# Patient Record
Sex: Female | Born: 1970 | Race: White | Hispanic: No | Smoking: Current every day smoker
Health system: Southern US, Community
[De-identification: ages and names within clinical notes are randomized; demographics above are authoritative.]

## PROBLEM LIST (undated history)

## (undated) DIAGNOSIS — B1920 Unspecified viral hepatitis C without hepatic coma: Secondary | ICD-10-CM

## (undated) DIAGNOSIS — D649 Anemia, unspecified: Secondary | ICD-10-CM

## (undated) HISTORY — DX: Anemia, unspecified: D64.9

## (undated) HISTORY — PX: NOVASURE ABLATION: SHX5394

## (undated) HISTORY — PX: TUBAL LIGATION: SHX77

---

## 2004-09-09 ENCOUNTER — Emergency Department: Payer: Self-pay | Admitting: Emergency Medicine

## 2005-02-01 ENCOUNTER — Emergency Department: Payer: Self-pay | Admitting: General Practice

## 2005-06-25 ENCOUNTER — Emergency Department: Payer: Self-pay | Admitting: Emergency Medicine

## 2005-08-13 ENCOUNTER — Emergency Department (HOSPITAL_COMMUNITY): Admission: EM | Admit: 2005-08-13 | Discharge: 2005-08-13 | Payer: Self-pay | Admitting: Emergency Medicine

## 2007-09-22 ENCOUNTER — Ambulatory Visit: Payer: Self-pay | Admitting: Obstetrics and Gynecology

## 2007-10-08 ENCOUNTER — Ambulatory Visit: Payer: Self-pay | Admitting: Obstetrics and Gynecology

## 2008-01-10 ENCOUNTER — Emergency Department: Payer: Self-pay | Admitting: Emergency Medicine

## 2008-03-25 ENCOUNTER — Emergency Department: Payer: Self-pay | Admitting: Emergency Medicine

## 2009-09-21 ENCOUNTER — Emergency Department: Payer: Self-pay | Admitting: Emergency Medicine

## 2009-09-22 ENCOUNTER — Ambulatory Visit (HOSPITAL_COMMUNITY): Admission: EM | Admit: 2009-09-22 | Discharge: 2009-09-22 | Payer: Self-pay | Admitting: Emergency Medicine

## 2010-04-14 ENCOUNTER — Emergency Department: Payer: Self-pay | Admitting: Emergency Medicine

## 2010-10-17 LAB — POCT I-STAT, CHEM 8
Calcium, Ion: 1.08 mmol/L — ABNORMAL LOW (ref 1.12–1.32)
Chloride: 108 mEq/L (ref 96–112)
Creatinine, Ser: 0.7 mg/dL (ref 0.4–1.2)
Hemoglobin: 15.3 g/dL — ABNORMAL HIGH (ref 12.0–15.0)
Potassium: 3.9 mEq/L (ref 3.5–5.1)

## 2011-04-25 ENCOUNTER — Ambulatory Visit: Payer: Self-pay | Admitting: Family Medicine

## 2011-04-28 ENCOUNTER — Ambulatory Visit: Payer: Self-pay | Admitting: Family Medicine

## 2011-05-29 ENCOUNTER — Ambulatory Visit (INDEPENDENT_AMBULATORY_CARE_PROVIDER_SITE_OTHER): Payer: Self-pay | Admitting: Family Medicine

## 2011-05-29 ENCOUNTER — Encounter: Payer: Self-pay | Admitting: Family Medicine

## 2011-05-29 VITALS — BP 120/74 | HR 66 | Temp 97.7°F | Ht 61.0 in | Wt 136.0 lb

## 2011-05-29 DIAGNOSIS — Z862 Personal history of diseases of the blood and blood-forming organs and certain disorders involving the immune mechanism: Secondary | ICD-10-CM | POA: Insufficient documentation

## 2011-05-29 DIAGNOSIS — Z720 Tobacco use: Secondary | ICD-10-CM | POA: Insufficient documentation

## 2011-05-29 DIAGNOSIS — F17200 Nicotine dependence, unspecified, uncomplicated: Secondary | ICD-10-CM | POA: Insufficient documentation

## 2011-05-29 DIAGNOSIS — Z0000 Encounter for general adult medical examination without abnormal findings: Secondary | ICD-10-CM | POA: Insufficient documentation

## 2011-05-29 LAB — BASIC METABOLIC PANEL
BUN: 8 mg/dL (ref 6–23)
Chloride: 103 mEq/L (ref 96–112)
Creat: 0.68 mg/dL (ref 0.50–1.10)
Glucose, Bld: 82 mg/dL (ref 70–99)
Potassium: 3.9 mEq/L (ref 3.5–5.3)

## 2011-05-29 LAB — CBC
HCT: 44 % (ref 36.0–46.0)
Hemoglobin: 14.3 g/dL (ref 12.0–15.0)
MCHC: 32.5 g/dL (ref 30.0–36.0)
RBC: 4.65 MIL/uL (ref 3.87–5.11)
WBC: 10.6 10*3/uL — ABNORMAL HIGH (ref 4.0–10.5)

## 2011-05-29 NOTE — Patient Instructions (Signed)
Come back and see me after the [employer] in. We will do a Pap test at that time. We will check basic labs today. Have a good Thanksgiving!

## 2011-05-29 NOTE — Assessment & Plan Note (Signed)
Check basic metabolic panel and CBC today. We'll obtain Pap once patient has orange card. She is leaving here to talk to. Jaynee Eagles today.

## 2011-05-29 NOTE — Assessment & Plan Note (Signed)
Provided counseling to quit.  we'll revisit this at next visit

## 2011-05-29 NOTE — Progress Notes (Signed)
  Subjective:    Patient ID: Claudia Cantu, female    DOB: May 19, 1971, 40 y.o.   MRN: [id number]  HPI The patient here for complete physical exam. She has no real past medical history to speak of except for anemia which seems to be secondary to menorrhagia. Menorrhagia treated with NovaSure. Has not been tested for anemia since this was placed about 3 years ago. Last Pap 7 years.  Tobacco abuse: Patient is a current everyday smoker. She's cut down from 2 packs a day to one pack a day for the past several months. Would like to get through holidays and then is interested in cutting down on her smoking.   Review of Systems The patient denies fever, unusual weight change, decreased hearing, chest pain, palpitations, pre-syncopal or syncopal episodes, dyspnea on exertion, prolonged cough, hemoptysis, change in bowel habits, melena, hematochezia, severe indigestion/heartburn, nausea/vomiting/abdominal pain, genital sores, muscle weakness, difficulty walking, abnormal bleeding, or enlarged lymph nodes.       Objective:   Physical Exam  Gen:  Alert, cooperative patient who appears stated age in no acute distress.  Vital signs reviewed. HEENT:  [place]/AT.  EOMI, PERRL.  MMM, tonsils non-erythematous, non-edematous.  External ears WNL, Bilateral TM's normal without retraction, redness or bulging.  Neck: No masses or thyromegaly or limitation in range of motion.  No cervical lymphadenopathy. Pulm:  Clear to auscultation bilaterally with good air movement.  No wheezes or rales noted.   Cardiac: Grade II/VI SEM noted Right upper sternal border.  RRR Abd:  Soft/nondistended/nontender.  Good bowel sounds throughout all four quadrants.  No masses noted.  Ext:  No clubbing/cyanosis/erythema.  No edema noted bilateral lower extremities.         Assessment & Plan:

## 2011-05-29 NOTE — Assessment & Plan Note (Signed)
cbc

## 2011-05-30 ENCOUNTER — Encounter: Payer: Self-pay | Admitting: Family Medicine

## 2011-06-02 ENCOUNTER — Telehealth: Payer: Self-pay | Admitting: Family Medicine

## 2011-06-02 NOTE — Telephone Encounter (Signed)
Forwarded to pcp.Alsace Dowd Lynetta  

## 2011-06-02 NOTE — Telephone Encounter (Signed)
Please call pt asap with results of latest labs

## 2011-06-04 NOTE — Telephone Encounter (Signed)
Called patient, she had received results in mail.

## 2012-10-07 ENCOUNTER — Emergency Department: Payer: Self-pay | Admitting: Emergency Medicine

## 2012-10-07 LAB — COMPREHENSIVE METABOLIC PANEL
Alkaline Phosphatase: 76 U/L (ref 50–136)
Anion Gap: 2 — ABNORMAL LOW (ref 7–16)
BUN: 8 mg/dL (ref 7–18)
Calcium, Total: 8.6 mg/dL (ref 8.5–10.1)
Chloride: 108 mmol/L — ABNORMAL HIGH (ref 98–107)
Co2: 27 mmol/L (ref 21–32)
Creatinine: 0.85 mg/dL (ref 0.60–1.30)
EGFR (African American): >60
SGOT(AST): 35 U/L (ref 15–37)

## 2012-10-07 LAB — URINALYSIS, COMPLETE
Protein: NEGATIVE
WBC UR: <1 /HPF (ref 0–5)

## 2012-10-07 LAB — CBC
HCT: 42.8 % (ref 35.0–47.0)
Platelet: 167 10*3/uL (ref 150–440)

## 2012-10-26 ENCOUNTER — Ambulatory Visit: Payer: Self-pay | Admitting: Obstetrics and Gynecology

## 2012-10-26 LAB — BASIC METABOLIC PANEL: Potassium: 3.9 mmol/L (ref 3.5–5.1)

## 2012-11-01 ENCOUNTER — Ambulatory Visit: Payer: Self-pay | Admitting: Obstetrics and Gynecology

## 2014-02-17 ENCOUNTER — Inpatient Hospital Stay: Payer: Self-pay | Admitting: Specialist

## 2014-02-17 LAB — COMPREHENSIVE METABOLIC PANEL
ALBUMIN: 3.6 g/dL (ref 3.4–5.0)
ALT: 21 U/L
Alkaline Phosphatase: 70 U/L
Anion Gap: 10 (ref 7–16)
BUN: 19 mg/dL — AB (ref 7–18)
Bilirubin,Total: 0.3 mg/dL (ref 0.2–1.0)
CALCIUM: 8.5 mg/dL (ref 8.5–10.1)
CHLORIDE: 107 mmol/L (ref 98–107)
CREATININE: 0.85 mg/dL (ref 0.60–1.30)
Co2: 21 mmol/L (ref 21–32)
EGFR (Non-African Amer.): >60
Glucose: 109 mg/dL — ABNORMAL HIGH (ref 65–99)
OSMOLALITY: 279 (ref 275–301)
POTASSIUM: 3.6 mmol/L (ref 3.5–5.1)
SGOT(AST): 28 U/L (ref 15–37)
SODIUM: 138 mmol/L (ref 136–145)
TOTAL PROTEIN: 7.5 g/dL (ref 6.4–8.2)

## 2014-02-17 LAB — URINALYSIS, COMPLETE
BILIRUBIN, UR: NEGATIVE
Bacteria: NONE SEEN
Blood: NEGATIVE
GLUCOSE, UR: NEGATIVE mg/dL (ref 0–75)
LEUKOCYTE ESTERASE: NEGATIVE
Nitrite: NEGATIVE
PH: 8 (ref 4.5–8.0)
PROTEIN: NEGATIVE
RBC,UR: NONE SEEN /HPF (ref 0–5)
SPECIFIC GRAVITY: 1.016 (ref 1.003–1.030)
Squamous Epithelial: 15
WBC UR: NONE SEEN /HPF (ref 0–5)

## 2014-02-17 LAB — CBC
HCT: 30.7 % — AB (ref 35.0–47.0)
HGB: 9.8 g/dL — AB (ref 12.0–16.0)
MCH: 30.7 pg (ref 26.0–34.0)
MCHC: 31.9 g/dL — AB (ref 32.0–36.0)
MCV: 97 fL (ref 80–100)
Platelet: 423 10*3/uL (ref 150–440)
RBC: 3.19 10*6/uL — AB (ref 3.80–5.20)
RDW: 12.9 % (ref 11.5–14.5)
WBC: 12.6 10*3/uL — AB (ref 3.6–11.0)

## 2014-02-17 LAB — HEMOGLOBIN: HGB: 7.4 g/dL — AB (ref 12.0–16.0)

## 2014-02-17 LAB — LIPASE, BLOOD: LIPASE: 109 U/L (ref 73–393)

## 2014-02-17 LAB — PROTIME-INR
INR: 1.1
PROTHROMBIN TIME: 13.8 s (ref 11.5–14.7)

## 2014-02-17 LAB — APTT: Activated PTT: 28 secs (ref 23.6–35.9)

## 2014-02-18 LAB — BASIC METABOLIC PANEL
ANION GAP: 10 (ref 7–16)
BUN: 11 mg/dL (ref 7–18)
CALCIUM: 8 mg/dL — AB (ref 8.5–10.1)
CHLORIDE: 108 mmol/L — AB (ref 98–107)
CREATININE: 0.68 mg/dL (ref 0.60–1.30)
Co2: 21 mmol/L (ref 21–32)
EGFR (African American): >60
Glucose: 79 mg/dL (ref 65–99)
Osmolality: 276 (ref 275–301)
Potassium: 3.5 mmol/L (ref 3.5–5.1)
SODIUM: 139 mmol/L (ref 136–145)

## 2014-02-18 LAB — MAGNESIUM: Magnesium: 1.7 mg/dL — ABNORMAL LOW

## 2014-02-18 LAB — CBC WITH DIFFERENTIAL/PLATELET
BASOS ABS: 0 10*3/uL (ref 0.0–0.1)
Basophil %: 0.6 %
EOS ABS: 0.1 10*3/uL (ref 0.0–0.7)
EOS PCT: 0.9 %
HCT: 34.1 % — ABNORMAL LOW (ref 35.0–47.0)
HGB: 11.4 g/dL — AB (ref 12.0–16.0)
LYMPHS ABS: 2.5 10*3/uL (ref 1.0–3.6)
Lymphocyte %: 31.8 %
MCH: 31 pg (ref 26.0–34.0)
MCHC: 33.6 g/dL (ref 32.0–36.0)
MCV: 92 fL (ref 80–100)
MONOS PCT: 6.1 %
Monocyte #: 0.5 x10 3/mm (ref 0.2–0.9)
Neutrophil #: 4.8 10*3/uL (ref 1.4–6.5)
Neutrophil %: 60.6 %
Platelet: 217 10*3/uL (ref 150–440)
RBC: 3.69 10*6/uL — ABNORMAL LOW (ref 3.80–5.20)
RDW: 14.2 % (ref 11.5–14.5)
WBC: 8 10*3/uL (ref 3.6–11.0)

## 2014-02-19 LAB — CBC WITH DIFFERENTIAL/PLATELET
Basophil #: 0 10*3/uL (ref 0.0–0.1)
Basophil %: 0.7 %
EOS PCT: 1.2 %
Eosinophil #: 0.1 10*3/uL (ref 0.0–0.7)
HCT: 32 % — ABNORMAL LOW (ref 35.0–47.0)
HGB: 11 g/dL — AB (ref 12.0–16.0)
LYMPHS ABS: 2.3 10*3/uL (ref 1.0–3.6)
Lymphocyte %: 33.2 %
MCH: 31.2 pg (ref 26.0–34.0)
MCHC: 34.2 g/dL (ref 32.0–36.0)
MCV: 91 fL (ref 80–100)
MONOS PCT: 6.7 %
Monocyte #: 0.5 x10 3/mm (ref 0.2–0.9)
NEUTROS PCT: 58.2 %
Neutrophil #: 4 10*3/uL (ref 1.4–6.5)
Platelet: 217 10*3/uL (ref 150–440)
RBC: 3.51 10*6/uL — AB (ref 3.80–5.20)
RDW: 14.2 % (ref 11.5–14.5)
WBC: 6.9 10*3/uL (ref 3.6–11.0)

## 2014-02-21 LAB — PATHOLOGY REPORT

## 2014-06-05 ENCOUNTER — Emergency Department: Payer: Self-pay | Admitting: Internal Medicine

## 2014-08-12 IMAGING — US US PELV - US TRANSVAGINAL
1 series · 13 of 25 positions shown · non-contrast
Comparison: none

REASON FOR EXAM: pain
COMMENTS:

[Series 1: us pelv - us transvaginal · 0.18mm/px · 106 acquisitions, 13 frames shown]
[im 1/106]
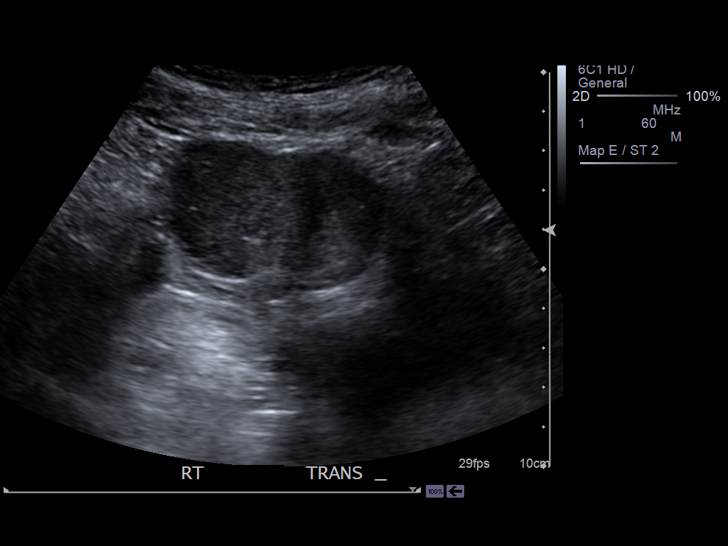
[im 9/106]
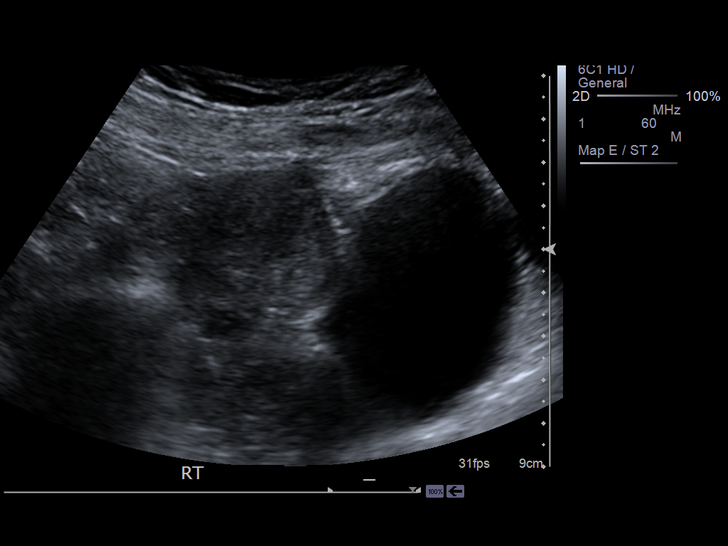
[im 18/106]
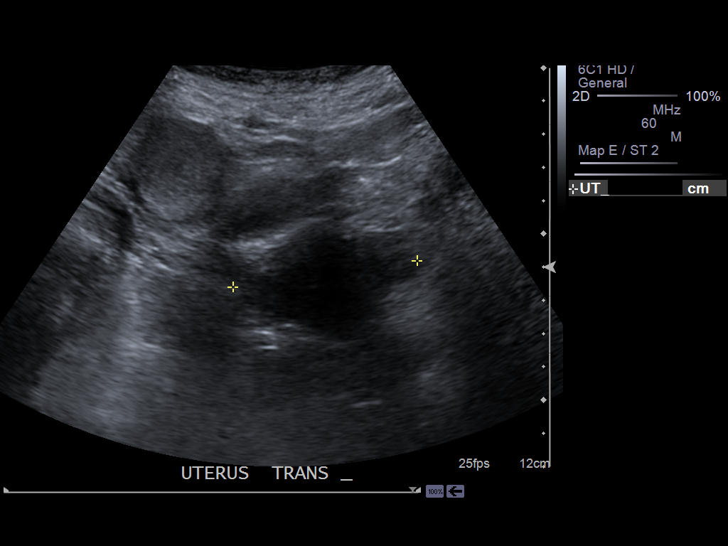
[im 27/106]
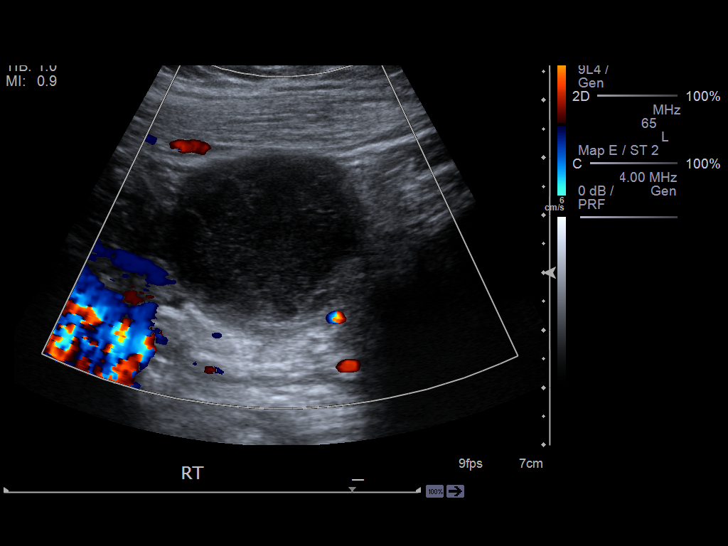
[im 36/106]
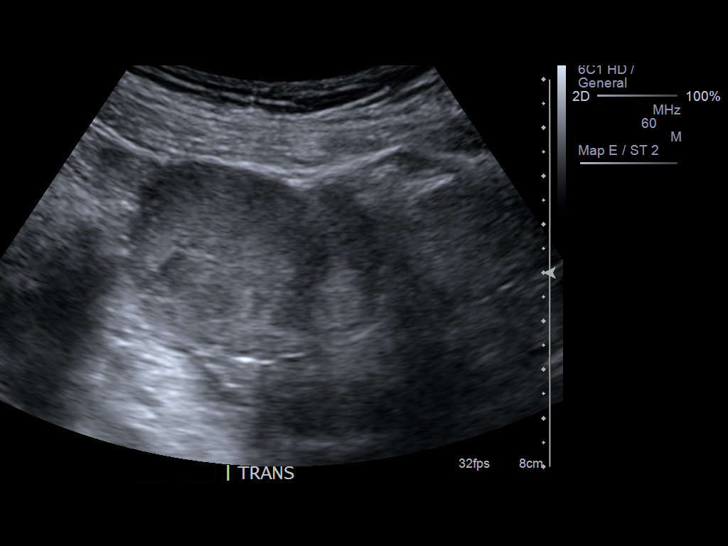
[im 44/106]
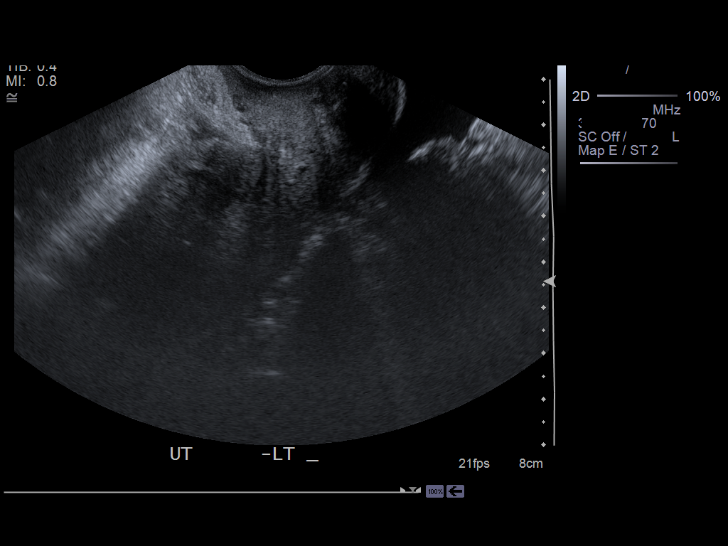
[im 53/106]
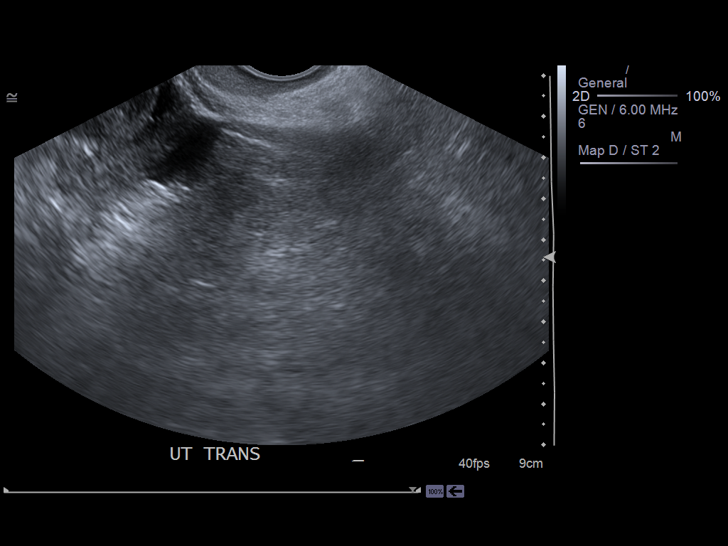
[im 62/106]
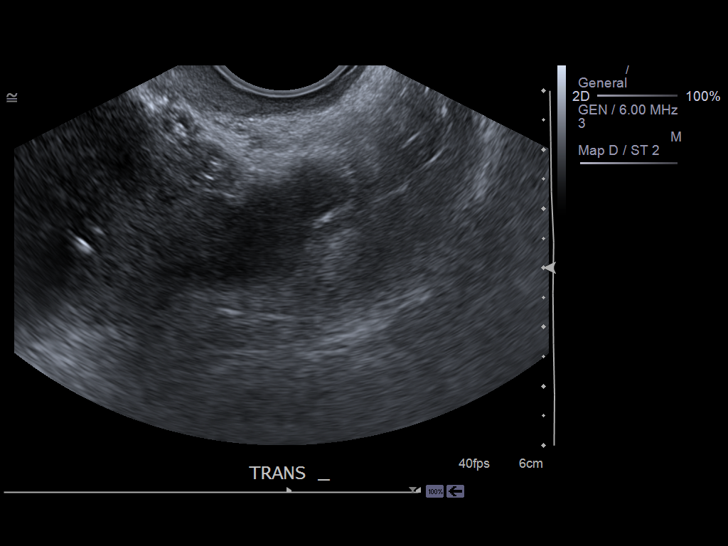
[im 71/106]
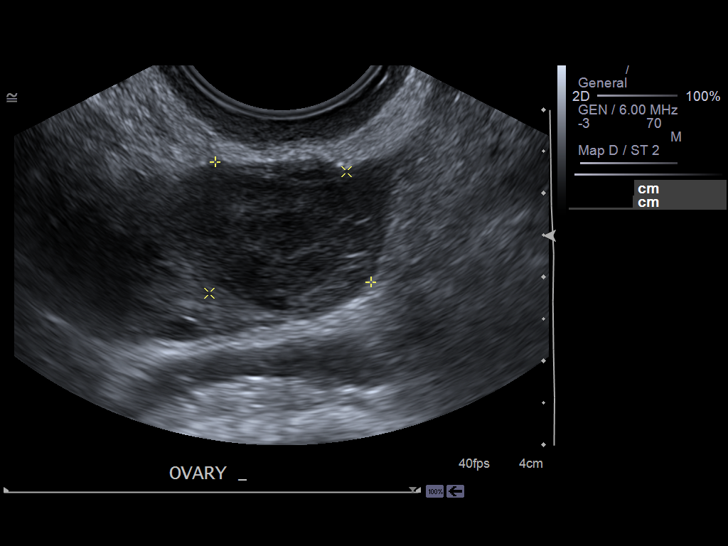
[im 79/106]
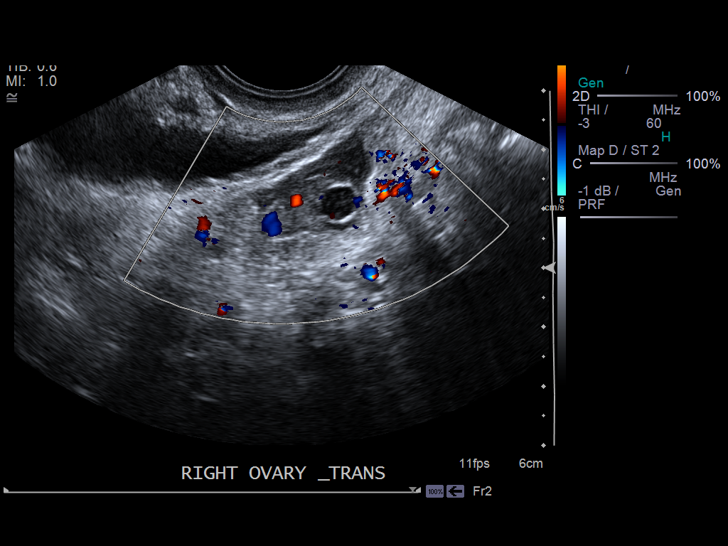
[im 88/106]
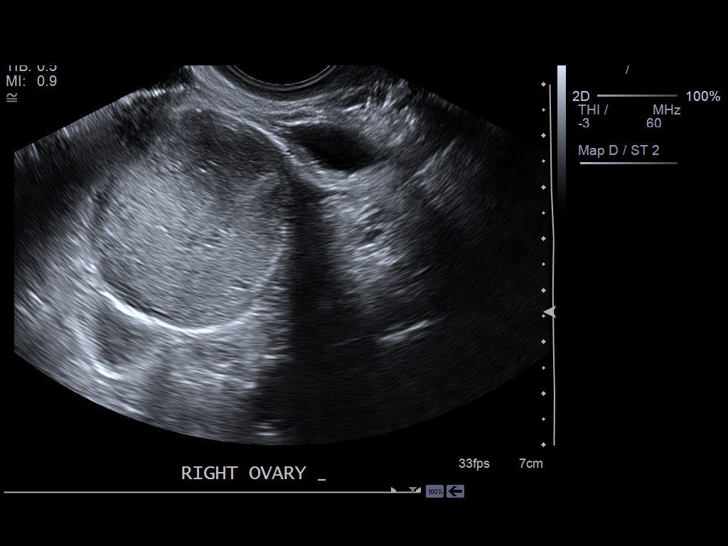
[im 97/106]
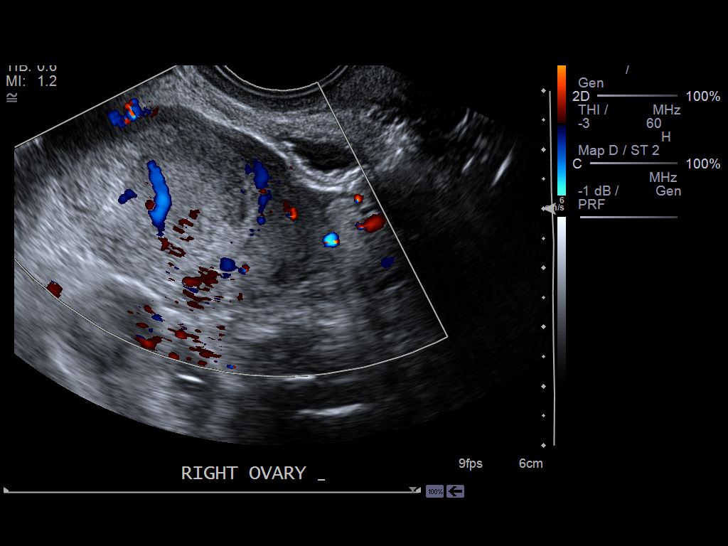
[im 106/106]
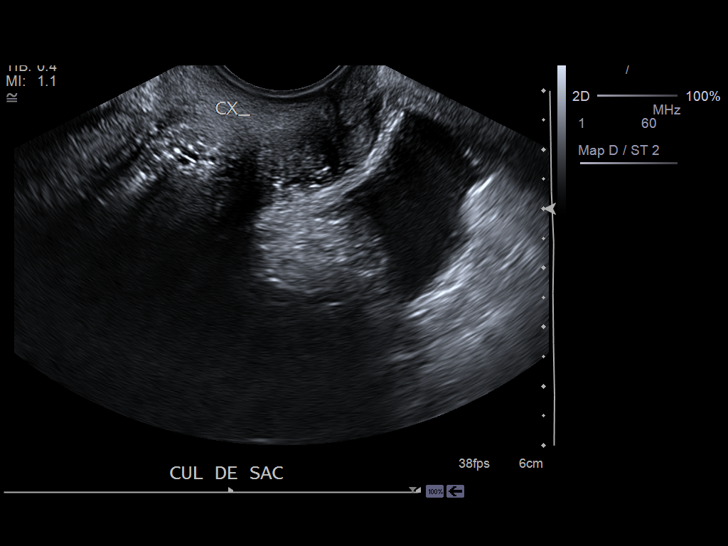

[13 of 25 positions shown; findings below may reference images not displayed]

PROCEDURE:     US  - US PELVIS EXAM W/TRANSVAGINAL  - October 07, 2012  [DATE]

RESULT:     Pelvic sonogram is performed with transabdominal and endovaginal
scanning. The patient has a current CT scan showing a pelvic mass in the
right adnexa. The uterus measures 7.94 x 3.44 x 5.55 cm. There is cul-de-sac
fluid present. The endometrial thickness is 6.2 mm. There is a an abnormal
appearance the cervix with possible punctate calcifications. There may be
some shadowing from these. There is a mixed echotexture predominantly solid
mass in the right adnexa with some adjacent normal-appearing ovarian tissue.
The entire complex measures approximately 5.58 x 3.69 x 4.37 cm. The left
ovary measures 3.18 x 2.00 x 3.38 cm and contains a hypoechoic structure
which could represent a proteinaceous or hemorrhagic cyst measuring 2.35 x
2.19 x 2.11 cm. The kidneys show no gross abnormality.
IMPRESSION: 1. Complex spurt only solid mass in the right ovary. Free fluid is seen in
the cul-de-sac. Some cervical calcification is present. There is the more
hypoechoic structure in the left ovary possibly hemorrhagic cyst or
proteinaceous cyst. Gynecologic followup is recommended.

[REDACTED]

## 2014-11-17 NOTE — Op Note (Signed)
PATIENT NAME:  Claudia Cantu, Claudia Cantu MR#:  [id number] DATE OF BIRTH:  04/09/1971  DATE OF PROCEDURE:  11/01/2012  PREOPERATIVE DIAGNOSES:  1.  Right pelvic pain.  2.  Complex right ovarian cyst.   POSTOPERATIVE DIAGNOSES: 1.  Right ovarian complex cyst.  2.  Pelvic pain.  3.  Simple left ovarian cyst.  PROCEDURES: 1.  Laparoscopic right salpingo-oophorectomy.  2.  Left ovarian cystotomy.  3.  Left salpingectomy.   INDICATIONS: A 44 year old gravida 4, para 3 patient with a 2-week history of right-sided pelvic pain requiring daily narcotics. Ultrasound and CT scan previously showed a complex right ovarian mass.   DESCRIPTION OF PROCEDURE: After adequate general endotracheal anesthesia, the patient was placed in the dorsal supine position. The patient's abdomen, perineum and vagina were prepped and draped in normal sterile fashion. The bladder was catheterized with a Foley catheter, yielding clear urine. A weighted speculum was placed in the vagina and the cervix was grasped with a single-toothed tenaculum and a Kahn cannula placed in the endocervical canal to be used for uterine manipulation during the procedure. Gloves were changed. A 10 mm infraumbilical incision was made, followed by placement of the laparoscope into the abdominal cavity under direct visualization. The patient's abdomen was then insufflated with carbon dioxide. A second port site, left lower quadrant, 3 cm medial to the anterior iliac spine under direct visualization. A  #10 mm port was then advanced into the abdominal cavity. A third port site was placed through the right lower quadrant again after injecting with 0.5% Marcaine, and a 5 mm port was advanced into the abdominal cavity under direct visualization. Attention was directed to the patient's right fallopian tube. Ovary seemed irregularly-shaped, approximately 3 x 2 cm. The fimbriated end of the fallopian tube was grasped and the infundibulopelvic ligament was cauterized with  the Kleppingers and the right tube and ovary were then sequentially clamped, cauterized and ultimately removed intact. Good hemostasis noted on the right side. This right tube and ovary was placed into an EndoCatch bag and this was removed from the left lower port site. Attention directed to the patient's left fallopian tube, which again was grasped at the fimbriated portion, and distal portion of fallopian tube was removed. The patient did have a previous tubal ligation. This was removed without difficulty. Of note, there was a 4 x 4 cm ovarian cyst. This was opened with the Harmonic scalpel and clear fluid resulted. The cystotomy edges were oozing, which ultimately was controlled with Bovie and Arista. The patient's abdomen was suctioned. The patient's appendix appeared normal. The upper abdomen appeared normal. No other significant scar tissue noted. Uterus appeared normal. The patient's abdomen was deflated, and the infraumbilical and left lower quadrant port sites were closed in a 2-layer fashion with the fascial layer closed with 2-0 Vicryl suture and skin was reapproximated with interrupted 4-0 Vicryl suture. The right lower quadrant site was closed with 4-0 Vicryl interrupted suture. All incisions were Steri-Stripped and Tegaderm placed on top. The single-toothed tenaculum was removed from the patient's vagina. There was no active bleeding. The procedure was terminated.   ESTIMATED BLOOD LOSS: 50 mL.   URINE OUTPUT: 100 mL.   URINE OUTFLOW: 700 mL.   The patient tolerated the procedure well and was taken to the recovery room in good condition.    ____________________________ Suzy Bouchardhomas J. Schermerhorn, MD tjs:dm D: 11/01/2012 10:42:00 ET T: 11/01/2012 11:07:45 ET JOB#: [id number]  cc: Suzy Bouchardhomas J. Schermerhorn, MD, <Dictator> Suzy BouchardHOMAS J SCHERMERHORN MD ELECTRONICALLY  SIGNED 11/03/2012 9:52

## 2014-11-18 NOTE — H&P (Signed)
PATIENT NAME:  Claudia Cantu, Claudia Cantu MR#:  [id number] DATE OF BIRTH:  03/10/1971  DATE OF ADMISSION:  02/17/2014  REASON FOR ADMISSION: GI bleeding and hematemesis.   PRIMARY CARE PHYSICIAN: Dr. Lacie Scotts.   REFERRING PHYSICIAN: Loleta Rose, MD.  HISTORY OF PRESENT ILLNESS: This is a very nice 44 year old female with history of previously diagnosed peptic ulcer disease, polyps, and chronic headaches. This patient came up today with a history of at 10:00 a.m. starting to have vomiting episodes since 10:00 a.m. until  2:30 p.m., black coffee-ground emesis with occasional dark blood on it. The patient states that last week she had a similar problem that lasted for 2 hours, vomited a couple of times with coffee ground and then went away. The patient denies any melena. She says that she hasn't had diarrhea. She is being slightly constipated. She has been having occasional abdominal cramps over the last 24-48 hours and, but overall, no significant abdominal pain. The patient states that she is being her normal self. No dizziness, lightheadedness, no difficulty ambulating. No shortness of breath. She came to the Emergency Department. In the Emergency Department, she has a positive guaiac and she has significant trouble for hemoglobin from previous labs down to 9.8, previously 14.5. The patient is admitted for evaluation and treatment of this condition.   REVIEW OF SYSTEMS:  CONSTITUTIONAL: No fever, fatigue or weakness.  EYES: No blurry vision, double vision.  EARS, NOSE, THROAT: No difficulty swallowing. No tinnitus.  RESPIRATORY: No shortness of breath, wheezing. The patient is a smoker. Smoking cessation counseling given for over 4 minutes. The patient states that she would like to try nicotine patches to see if she can at least slow down her smoking.  CARDIOVASCULAR: No chest pain, orthopnea, or edema.  GASTROINTESTINAL: Positive nausea, positive vomiting. Positive hematemesis. Denies melena. No abdominal  pain at this moment. GENITOURINARY:  No dysuria or hematuria.  GYNECOLOGIC: No obvious masses.  ENDOCRINE: No polyuria, polydipsia, polyphagia.  HEMATOLOGIC AND LYMPHATIC: No anemia, easy bruising or bleeding that she knew about.  SKIN: No rashes or petechiae.  MUSCULOSKELETAL: No significant neck pain.  NEUROLOGIC: Positive for back pain. She takes multiple BC powders, 5-6 a day.  PSYCHIATRIC: No insomnia or agitation.   PAST MEDICAL HISTORY:  1.  Chronic headaches. 2.  Chronic pain syndrome. 3.  Peptic ulcer disease. 4.  Colonic polyps.   ALLERGIES: PENICILLIN MAKES HER SICK ON THE STOMACH.  CODEINE MAKES HER SICK TO HER STOMACH AS WELL.   PAST SURGICAL HISTORY:  1.  EGD.  2.  Colonoscopy a year and a half ago.  3.  Cesarean section.  4.  Partial hysterectomy.  5.  Vaginal wall repair.   SOCIAL HISTORY: The patient is a smoker of 2 packs a day, alcohol rare.  Patient stays at home and takes care of her son who is paraplegic.  He is 38 years old.  He got paraplegia due to a gunshot wound 5 years ago.   FAMILY HISTORY: Positive for peptic ulcer disease in his father who also had COPD, hypertension, and bladder cancer.   MEDICATIONS:  BC powders 5- 6 times a day.   PHYSICAL EXAMINATION:  VITAL SIGNS: Blood pressure 131/89, pulse in between 102 and 168, respirations in between 18 to 34, blood pressure 131/89, oxygen saturation 100% on room air.  GENERAL: The patient is alert and oriented x 3, no acute distress. No respiratory distress. Hemodynamically stable.  HEENT: Her pupils are equal and reactive. Extraocular movements are  intact. Mucosa are moist. Anicteric sclerae. Pink conjunctivae. No oral lesions. No oropharyngeal exudates.  NECK: Supple. No JVD. No thyromegaly. No adenopathy. No carotid bruits.  CARDIOVASCULAR: Regular rate and rhythm. No murmurs, rubs or gallops.  Tachycardic. No displacement of PMI.  LUNGS: Clear without any wheezing or crepitus. No use of accessory  muscles. ABDOMEN:  Soft, mildly tender to deep palpation at the level of the epigastric area, but no significant rebound. No hepatosplenomegaly. No masses. Bowel sounds are positive.  GENITAL: Deferred.  EXTREMITIES: No edema, cyanosis, or clubbing. Pulses +2. Capillary refill less than 3.  NEUROLOGIC: Cranial nerves II-XII intact.  SKIN: No rashes or petechiae.  MUSCULOSKELETAL: No joint effusions or swelling.  LYMPHATIC: Negative for lymphadenopathy in neck or supraclavicular areas.   LABORATORY DATA: Glucose 109, BUN 19, creatinine 0.85, other electrolytes within normal limits. Potassium is slightly low at 3.6. LFTs within normal limits. White blood count 12,000, hemoglobin 9.8. Previous hemoglobin 14.5 a year ago, platelet count 423,000.  INR 1.1.   EKG: Sinus tachycardia. No ST depression or elevation.   ASSESSMENT AND PLAN: A 44 year old female with history of peptic ulcers. She stopped  taking her Protonix a long time ago.  Takes multiple doses of BC powders a day and she is a heavy smoker.   1.  Gastrointestinal bleeding. This is likely secondary to an ulcer and secondary to irritation ulcerative gastritis due to [facility] powders and smoking.   2.  The patient n.p.o. IV fluids given as she is tachycardic and tachypneic. No transfusion indicated at this moment.  Her hemoglobin is 9.8. She does have acute blood loss anemia and is secondary to gastrointestinal bleeding and severe hematemesis, more than 8 episodes. No history of drinking. No history of esophageal varices.   3.  The patient had a big ulcer a year and a half ago, and this seems to be the same problem.   4.  Protonix bolus and drip started.  Hemoglobin serial checks every 6 hours. Continue to provide fluid resuscitation as the patient remains tachycardic. GI consultation to be obtained.   5.  Systemic inflammatory response syndrome. The patient has criteria with tachypnea, tachycardia, elevation of white blood cell count above  12,000, but not febrile.  This is likely secondary to her bleeding.  What we are going to do for that is give her IV fluids. Monitor  closely. Check white blood count in the morning again and consider the need of antibiotics.  At this moment, the patient does not show any significant signs of infection. Will go and get a urinalysis to make sure she does not have a urinary tract infection.   6.  Acute blood loss anemia. Transfuse if hemoglobin below 7, or hemodynamically unstable.   7.  History of chronic headaches. The patient takes [facility] powders for that. The patient needs to stop taking that. 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  Treat headaches with Tylenol for now.   8.  Gastrointestinal prophylaxis with Protonix drip and deep vein thrombosis prophylaxis, unable to prove hemo-prophylaxis due to the active bleeding. Use pneumatic compression devices.   THE PATIENT IS A FULL CODE.   TIME SPENT: I spent about 45 minutes with this patient.     ____________________________ Felipa Furnaceoberto Sanchez Gutierrez, MD rsg:ts D: 02/17/2014 17:25:28 ET T: 02/17/2014 18:01:29 ET JOB#: [id number]  cc: Felipa Furnaceoberto Sanchez Gutierrez, MD, <Dictator> Sybil Shrader Juanda ChanceSANCHEZ GUTIERRE MD ELECTRONICALLY SIGNED 02/21/2014 0:32

## 2014-11-18 NOTE — Consult Note (Signed)
PATIENT NAME:  Claudia Cantu, Claudia Cantu MR#:  [id number] DATE OF BIRTH:  03-08-1971  DATE OF CONSULTATION:  02/17/2014  CONSULTING PHYSICIAN:  Scot Junobert T. Elliott, MD  The patient is a 44 year old female, history of [facility]oscopy about a year and a half ago. The patient has been taking multiple BC Powders, 5 to 6 a day, for back pain, and she began having hematemesis with black coffee-ground emesis, occasional dark blood. This problem lasted about 2 hours. She also had abdominal cramps, but no melena. She was seen in the ER because her hemoglobin was down to 9.8 and previously was 14. She was admitted to the hospital, and I was asked to see her in consultation.   REVIEW OF SYSTEMS: The patient denies any fever. No recent change in vision. No dysphagia. She does have nausea and vomiting and mild epigastric abdominal tenderness. No asthma or wheezing.  She has back pain that makes her take the BC's.  ALLERGIES: PENICILLIN AND CODEINE AND HYDROCODONE.   She is not a Jehovah's Witness. [facility], she had a small ulcer. Last surgery was done a year ago, and she had a complex right ovarian cyst removed by Dr. Feliberto GottronSchermerhorn.    PAST MEDICAL HISTORY:  Chronic headaches, chronic back pain, peptic ulcer disease and previous colon polyps.   SOCIAL HISTORY: Smokes 2 packs a day. Rare alcohol. She stays at home and takes care of her son who is a paraplegic due to a gunshot wound 5 years ago   PAST SURGICAL HISTORY: Partial hysterectomy   MEDICATIONS: BC Powders 5 to 6 times a day.   PHYSICAL EXAMINATION: GENERAL: White female in no acute distress.  VITAL SIGNS: Temp 98.2, pulse 87, respirations 12, blood pressure 124/87.  HEENT: Sclerae anicteric. Conjunctivae negative. Tongue negative.  HEAD: Atraumatic.  CHEST: Clear.  HEART: Shows no murmurs or gallops I can hear.  ABDOMEN:  Shows epigastric tenderness to palpation. No  hepatosplenomegaly. No masses  SKIN: Warm and dry.  PSYCHIATRIC: Mood and affect are appropriate.  ASSESSMENT: Hematemesis in a patient taking multiple BCs a day, also smokes 2 packs a day,  likely does have ulcer disease, possibly gastric and duodenal, possible gastritis and duodenitis. I doubt varices.  Plan is to do an upper endoscopy. This has been scheduled for 8:00 in the morning. I discussed with her that she will be put to sleep briefly by her anesthesia and that it is possible we may cauterize her ulcers. Endoscopy will help us know how long to keep her hospitalized.  Will set it up for 8:00 in the morning.   ____________________________ Scot Junobert T. Elliott, MD rte:dmm D: 02/17/2014 20:34:00 ET T: 02/17/2014 21:00:25 ET JOB#: [id number]  cc: Scot Junobert T. Elliott, MD, <Dictator> Meindert A. Lacie ScottsNiemeyer, MD Scot JunOBERT T ELLIOTT MD ELECTRONICALLY SIGNED 03/06/2014 17:59

## 2014-11-18 NOTE — Consult Note (Signed)
Pt doing well. VSS afebrile, tol clear liq diet and wants to advance to full liquid and go home.  She was advised to avoid NSAID, eat very soft diet for several days and follow up in my office in 2 weeks.  Electronic Signatures: Scot JunElliott, Robert T (MD)  (Signed on 26-Jul-15 09:36)  Authored  Last Updated: 26-Jul-15 09:36 by Scot JunElliott, Robert T (MD)

## 2014-11-18 NOTE — Consult Note (Signed)
Pt hgb has fallen 2 1/2 in 6 hours.  I will order transfusion of 2 units due to GI bleed from probable ulcer from NSAID/ASA medication  Electronic Signatures: Scot JunElliott, Yony Roulston T (MD)  (Signed on 24-Jul-15 22:26)  Authored  Last Updated: 24-Jul-15 22:26 by Scot JunElliott, Evelynne Spiers T (MD)

## 2014-11-18 NOTE — Discharge Summary (Signed)
PATIENT NAME:  Claudia Cantu, Claudia Cantu MR#:  [id number] DATE OF BIRTH:  02/24/1971  DATE OF ADMISSION:  02/17/2014 DATE OF DISCHARGE:  02/19/2014  For a detailed note please refer to the history and physical done on admission by Dr. Mordecai MaesSanchez.  DIAGNOSES AT DISCHARGE: Are as follows:  1. Upper gastrointestinal bleed secondary to a peptic ulcer disease and gastritis/peptic ulcer disease.  2. Tobacco abuse.   The patient is being discharged on a mechanical soft regular diet.   ACTIVITY: As tolerated.   FOLLOWUP: Follow up with Dr. Lacie ScottsNiemeyer in the next 1-2 weeks. Also follow up with Dr. Mechele CollinElliott in the next 1-2 weeks.   CONSULTANTS DURING THE HOSPITAL COURSE: Dr. Lynnae Prudeobert Elliott from gastroenterology.   PERTINENT STUDIES DONE DURING THE HOSPITAL COURSE: As follows: Upper GI endoscopy done on 02/18/2014, showing erythematous mucosa in the gastric body which was biopsied, gastric ulcer with clean base.   HOSPITAL COURSE: This is a 44 year old female who presented to the hospital due to coffee ground emesis.   PROBLEMS: 1. Upper gastrointestinal bleed. This was likely the cause of the patient's coffee ground emesis. The patient apparently suffers from chronic pain and migraine headaches for which she has been taking BC Powder for a long time. She also had a history of peptic ulcer disease in the past. The patient was admitted to the hospital, had serial hemoglobins checked. Hemoglobin then dropped down to as low as 9. Given her symptomatic anemia, she was transfused 2 units of packed red blood cells. Her hemoglobin has improved posttransfusion and remained stable. She was seen by gastroenterology, underwent an upper GI endoscopy, which showed a gastric ulcer and gastritis but no evidence of acute bleeding presently. Post endoscopy the patient has had no further evidence of coffee ground emesis and remained stable. She is also tolerating p.o. and is therefore being discharged home. She has been strongly advised  to avoid NSAIDs and is being discharged on Protonix twice daily. She will have followup with gastroenterology as an outpatient.  2. A history of chronic pain and headaches. The patient was strongly advised to avoid NSAIDs and BC Powder given her gastrointestinal bleed and peptic ulcer disease. She was advised to take some Tylenol as needed. 3. Tobacco abuse. The patient was maintained on nicotine patch while in the hospital.   CODE STATUS: The patient is a full code.   TIME SPENT ON DISCHARGE: 40 minutes.     ____________________________ Rolly PancakeVivek J. Cherlynn KaiserSainani, MD vjs:lt D: 02/19/2014 12:15:07 ET T: 02/19/2014 21:55:29 ET JOB#: [id number]  cc: Rolly PancakeVivek J. Cherlynn KaiserSainani, MD, <Dictator> Meindert A. Lacie ScottsNiemeyer, MD Scot Junobert T. Elliott, MD Houston SirenVIVEK J Brealynn Contino MD ELECTRONICALLY SIGNED 02/23/2014 14:09

## 2015-04-02 ENCOUNTER — Emergency Department
Admission: EM | Admit: 2015-04-02 | Discharge: 2015-04-02 | Disposition: A | Discharge status: Home / Self Care | Payer: Self-pay | Attending: Emergency Medicine | Admitting: Emergency Medicine

## 2015-04-02 ENCOUNTER — Encounter: Payer: Self-pay | Admitting: *Deleted

## 2015-04-02 DIAGNOSIS — T85848A Pain due to other internal prosthetic devices, implants and grafts, initial encounter: Secondary | ICD-10-CM

## 2015-04-02 DIAGNOSIS — Z880 Allergy status to penicillin: Secondary | ICD-10-CM | POA: Insufficient documentation

## 2015-04-02 DIAGNOSIS — K029 Dental caries, unspecified: Secondary | ICD-10-CM | POA: Insufficient documentation

## 2015-04-02 DIAGNOSIS — T8584XA Pain due to internal prosthetic devices, implants and grafts, not elsewhere classified, initial encounter: Secondary | ICD-10-CM | POA: Insufficient documentation

## 2015-04-02 DIAGNOSIS — Z720 Tobacco use: Secondary | ICD-10-CM | POA: Insufficient documentation

## 2015-04-02 DIAGNOSIS — Y831 Surgical operation with implant of artificial internal device as the cause of abnormal reaction of the patient, or of later complication, without mention of misadventure at the time of the procedure: Secondary | ICD-10-CM | POA: Insufficient documentation

## 2015-04-02 MED ORDER — TRAMADOL HCL 50 MG PO TABS: 50.0000 mg | ORAL_TABLET | Freq: Four times a day (QID) | ORAL | Status: AC | PRN

## 2015-04-02 MED ORDER — CLINDAMYCIN HCL 300 MG PO CAPS: 300.0000 mg | ORAL_CAPSULE | Freq: Three times a day (TID) | ORAL | Status: DC

## 2015-04-02 MED ORDER — OXYCODONE HCL 5 MG PO TABS
5.0000 mg | ORAL_TABLET | Freq: Once | ORAL | Status: AC
  Administered 2015-04-02: 5 mg via ORAL
  Filled 2015-04-02: qty 1

## 2015-04-02 NOTE — ED Provider Notes (Signed)
[facility] Emergency Department Provider Note  Time seen: 1:06 PM  I have reviewed the triage vital signs and the nursing notes.   HISTORY  Chief Complaint Dental Pain    HPI Claudia Cantu is a 44 y.o. female with a past medical history hepatitis C, anemia presents for right-sided dental pain. According to the patient for the past 4-5 days she has had increased pain in the right upper molar. Patient states poor dentition overall, has had several teeth pulled, and is in the process of trying to get this tooth pulled. Describes increased pain over the weekend, and no tenderness is open today so the patient came to the emergency department for relief. Denies fever, nausea or vomiting. The pain is moderate to severe located in the right side of her mouth, upper molar.    Past Medical History  Diagnosis Date  . Anemia     History of anemia and menorrhagia, menorrhagia treated with NovaSure    Patient Active Problem List   Diagnosis Date Noted  . Tobacco abuse 05/29/2011  . Preventative health care 05/29/2011  . History of anemia 05/29/2011    Past Surgical History  Procedure Laterality Date  . Cesarean section    . Tubal ligation    . Novasure ablation      No current outpatient prescriptions on file.  Allergies Penicillins and Codeine  No family history on file.  Social History Social History  Substance Use Topics  . Smoking status: Current Every Day Smoker -- 1.00 packs/day    Types: Cigarettes  . Smokeless tobacco: None  . Alcohol Use: No    Review of Systems Constitutional: Negative for fever ENT: As it for dental pain. Cardiovascular: Negative for chest pain. Respiratory: Negative for shortness of breath. Gastrointestinal: Negative for abdominal pain, vomiting and diarrhea. Neurological: Doesn't headache. 10-point ROS otherwise negative.  ____________________________________________   PHYSICAL EXAM:  VITAL SIGNS: ED  Triage Vitals  Enc Vitals Group     BP 04/02/15 1242 127/80 mmHg     Pulse Rate 04/02/15 1242 81     Resp 04/02/15 1242 20     Temp 04/02/15 1242 98 F (36.7 C)     Temp Source 04/02/15 1242 Oral     SpO2 04/02/15 1242 98 %     Weight 04/02/15 1242 140 lb (63.504 kg)     Height 04/02/15 1242 5' (1.524 m)     Head Cir --      Peak Flow --      Pain Score 04/02/15 1243 7     Pain Loc --      Pain Edu? --      Excl. in GC? --     Constitutional: Alert and oriented. Well appearing and in no distress. Eyes: Normal exam ENT   Head: Normocephalic and atraumatic.   Nose: No congestion/rhinnorhea.   Mouth/Throat: Very poor dentition overall. Patient does have a fractured tooth in the right upper molar location. No surrounding signs of abscess. Moderate tenderness to palpation. Cardiovascular: Normal rate, regular rhythm. No murmur Respiratory: Normal respiratory effort without tachypnea nor retractions. Breath sounds are clear and equal bilaterally. No wheezes/rales/rhonchi. Gastrointestinal: Soft and nontender. No distention. Musculoskeletal: Nontender with normal range of motion in all extremities. Neurologic:  Normal speech and language. No gross focal neurologic deficits  Skin:  Skin is warm, dry and intact.  Psychiatric: Mood and affect are normal. Speech and behavior are normal.   ____________________________________________    INITIAL IMPRESSION /  ASSESSMENT AND PLAN / ED COURSE  Pertinent labs & imaging results that were available during my care of the patient were reviewed by me and considered in my medical decision making (see chart for details).  Patient with likely dental infection. Patient will follow-up with [facility], which whom she is already a patient. We will place the patient on clindamycin (penicillin allergy) and Ultram. We will dose a one-time dose of oxycodone in the emergency department. Patient agreeable to  plan.  ____________________________________________   FINAL CLINICAL IMPRESSION(S) / ED DIAGNOSES  Dental pain and dental carry   Minna Antis, MD 04/02/15 1308

## 2015-04-02 NOTE — Discharge Instructions (Signed)
Dental Pain °A tooth ache may be caused by cavities (tooth decay). Cavities expose the nerve of the tooth to air and hot or cold temperatures. It may come from an infection or abscess (also called a boil or furuncle) around your tooth. It is also often caused by dental caries (tooth decay). This causes the pain you are having. °DIAGNOSIS  °Your caregiver can diagnose this problem by exam. °TREATMENT  °· If caused by an infection, it may be treated with medications which kill germs (antibiotics) and pain medications as prescribed by your caregiver. Take medications as directed. °· Only take over-the-counter or prescription medicines for pain, discomfort, or fever as directed by your caregiver. °· Whether the tooth ache today is caused by infection or dental disease, you should see your dentist as soon as possible for further care. °SEEK MEDICAL CARE IF: °The exam and treatment you received today has been provided on an emergency basis only. This is not a substitute for complete medical or dental care. If your problem worsens or new problems (symptoms) appear, and you are unable to meet with your dentist, call or return to this location. °SEEK IMMEDIATE MEDICAL CARE IF:  °· You have a fever. °· You develop redness and swelling of your face, jaw, or neck. °· You are unable to open your mouth. °· You have severe pain uncontrolled by pain medicine. °MAKE SURE YOU:  °· Understand these instructions. °· Will watch your condition. °· Will get help right away if you are not doing well or get worse. °Document Released: 07/14/2005 Document Revised: 10/06/2011 Document Reviewed: 03/01/2008 °ExitCare® Patient Information ©2015 [employer]. This information is not intended to replace advice given to you by your health care provider. Make sure you discuss any questions you have with your health care provider. ° °Dental Caries °Dental caries (also called tooth decay) is the most common oral disease. It can occur at any age but is  more common in children and young adults.  °HOW DENTAL CARIES DEVELOPS  °The process of decay begins when bacteria and foods (particularly sugars and starches) combine in your mouth to produce plaque. Plaque is a substance that sticks to the hard, outer surface of a tooth (enamel). The bacteria in plaque produce acids that attack enamel. These acids may also attack the root surface of a tooth (cementum) if it is exposed. Repeated attacks dissolve these surfaces and create holes in the tooth (cavities). If left untreated, the acids destroy the other layers of the tooth.  °RISK FACTORS °· Frequent sipping of sugary beverages.   °· Frequent snacking on sugary and starchy foods, especially those that easily get stuck in the teeth.   °· Poor oral hygiene.   °· Dry mouth.   °· Substance abuse such as methamphetamine abuse.   °· Broken or poor-fitting dental restorations.   °· Eating disorders.   °· Gastroesophageal reflux disease (GERD).   °· Certain radiation treatments to the head and neck. °SYMPTOMS °In the early stages of dental caries, symptoms are seldom present. Sometimes white, chalky areas may be seen on the enamel or other tooth layers. In later stages, symptoms may include: °· Pits and holes on the enamel. °· Toothache after sweet, hot, or cold foods or drinks are consumed. °· Pain around the tooth. °· Swelling around the tooth. °DIAGNOSIS  °Most of the time, dental caries is detected during a regular dental checkup. A diagnosis is made after a thorough medical and dental history is taken and the surfaces of your teeth are checked for signs of   dental caries. Sometimes special instruments, such as lasers, are used to check for dental caries. Dental X-ray exams may be taken so that areas not visible to the eye (such as between the contact areas of the teeth) can be checked for cavities.  °TREATMENT  °If dental caries is in its early stages, it may be reversed with a fluoride treatment or an application of a  remineralizing agent at the dental office. Thorough brushing and flossing at home is needed to aid these treatments. If it is in its later stages, treatment depends on the location and extent of tooth destruction:  °· If a small area of the tooth has been destroyed, the destroyed area will be removed and cavities will be filled with a material such as gold, silver amalgam, or composite resin.   °· If a large area of the tooth has been destroyed, the destroyed area will be removed and a cap (crown) will be fitted over the remaining tooth structure.   °· If the center part of the tooth (pulp) is affected, a procedure called a root canal will be needed before a filling or crown can be placed.   °· If most of the tooth has been destroyed, the tooth may need to be pulled (extracted). °HOME CARE INSTRUCTIONS °You can prevent, stop, or reverse dental caries at home by practicing good oral hygiene. Good oral hygiene includes: °· Thoroughly cleaning your teeth at least twice a day with a toothbrush and dental floss.   °· Using a fluoride toothpaste. A fluoride mouth rinse may also be used if recommended by your dentist or health care provider.   °· Restricting the amount of sugary and starchy foods and sugary liquids you consume.   °· Avoiding frequent snacking on these foods and sipping of these liquids.   °· Keeping regular visits with a dentist for checkups and cleanings. °PREVENTION  °· Practice good oral hygiene. °· Consider a dental sealant. A dental sealant is a coating material that is applied by your dentist to the pits and grooves of teeth. The sealant prevents food from being trapped in them. It may protect the teeth for several years. °· Ask about fluoride supplements if you live in a community without fluorinated water or with water that has a low fluoride content. Use fluoride supplements as directed by your dentist or health care provider. °· Allow fluoride varnish applications to teeth if directed by your  dentist or health care provider. °Document Released: 04/05/2002 Document Revised: 11/28/2013 Document Reviewed: 07/16/2012 °ExitCare® Patient Information ©2015 [employer]. This information is not intended to replace advice given to you by your health care provider. Make sure you discuss any questions you have with your health care provider. ° °

## 2015-04-02 NOTE — ED Notes (Signed)
Pt complains of right sided tooth pain

## 2015-04-03 MED ORDER — MORPHINE SULFATE (PF) 2 MG/ML IV SOLN
INTRAVENOUS | Status: AC
Start: 2015-04-03 — End: 2015-04-03
  Filled 2015-04-03: qty 1

## 2016-09-02 ENCOUNTER — Emergency Department
Admission: EM | Admit: 2016-09-02 | Discharge: 2016-09-02 | Disposition: A | Discharge status: Home / Self Care | Payer: Self-pay | Attending: Emergency Medicine | Admitting: Emergency Medicine

## 2016-09-02 ENCOUNTER — Encounter: Payer: Self-pay | Admitting: Emergency Medicine

## 2016-09-02 DIAGNOSIS — L03211 Cellulitis of face: Secondary | ICD-10-CM | POA: Insufficient documentation

## 2016-09-02 DIAGNOSIS — F17210 Nicotine dependence, cigarettes, uncomplicated: Secondary | ICD-10-CM | POA: Insufficient documentation

## 2016-09-02 HISTORY — DX: Unspecified viral hepatitis C without hepatic coma: B19.20

## 2016-09-02 MED ORDER — CLINDAMYCIN HCL 300 MG PO CAPS: 300.0000 mg | ORAL_CAPSULE | Freq: Three times a day (TID) | ORAL | 0 refills | Status: AC

## 2016-09-02 MED ORDER — CLINDAMYCIN HCL 150 MG PO CAPS
300.0000 mg | ORAL_CAPSULE | Freq: Once | ORAL | Status: AC
  Administered 2016-09-02: 300 mg via ORAL
  Filled 2016-09-02: qty 2

## 2016-09-02 NOTE — ED Provider Notes (Signed)
[facility] Emergency Department Provider Note ____________________________________________  Time seen: 1504  I have reviewed the triage vital signs and the nursing notes.  HISTORY  Chief Complaint  Insect Bite  HPI Claudia Cantu is a 46 y.o. female presents to the ED for evaluation of sudden onset of swelling to the chin upon awakening.Spot to the chin that she assumed was an insect or spider bite. She describes the areas itchy, but notes that it has continued to swell with increased redness and tenderness. She reports a tender lymph node under the chin as well. She notes some clear drainage at this time. She denies any fevers, chills, or sweats.   Past Medical History:  Diagnosis Date  . Anemia    History of anemia and menorrhagia, menorrhagia treated with NovaSure  . Hepatitis C     Patient Active Problem List   Diagnosis Date Noted  . Tobacco abuse 05/29/2011  . Preventative health care 05/29/2011  . History of anemia 05/29/2011    Past Surgical History:  Procedure Laterality Date  . CESAREAN SECTION    . NOVASURE ABLATION    . TUBAL LIGATION      Prior to Admission medications   Medication Sig Start Date End Date Taking? Authorizing Provider  clindamycin (CLEOCIN) 300 MG capsule Take 1 capsule (300 mg total) by mouth 3 (three) times daily. 09/02/16 09/12/16  Charlesetta Ivory Zyniah Ferraiolo, PA-C    Allergies Penicillins and Codeine  History reviewed. No pertinent family history.  Social History Social History  Substance Use Topics  . Smoking status: Current Every Day Smoker    Packs/day: 1.00    Types: Cigarettes  . Smokeless tobacco: Not on file  . Alcohol use No    Review of Systems  Constitutional: Negative for fever. Eyes: Negative for visual changes. ENT: Negative for sore throat. Cardiovascular: Negative for chest pain. Respiratory: Negative for shortness of breath. Genitourinary: Negative for dysuria. Skin: Negative for rash.  Reports chin swelling as above Neurological: Negative for headaches, focal weakness or numbness. ____________________________________________  PHYSICAL EXAM:  VITAL SIGNS: ED Triage Vitals [09/02/16 1336]  Enc Vitals Group     BP 113/75     Pulse Rate 83     Resp 16     Temp 97.4 F (36.3 C)     Temp Source Oral     SpO2 100 %     Weight 146 lb (66.2 kg)     Height 5' (1.524 m)     Head Circumference      Peak Flow      Pain Score 7     Pain Loc      Pain Edu?      Excl. in GC?     Constitutional: Alert and oriented. Well appearing and in no distress. Head: Normocephalic and atraumatic. Nose: No congestion/rhinorrhea/epistaxis. Mouth/Throat: Mucous membranes are moist. Neck: Supple. No thyromegaly. Hematological/Lymphatic/Immunological: Palpable submental lymphadenopathy. Cardiovascular: Normal rate, regular rhythm. Normal distal pulses. Respiratory: Normal respiratory effort. No wheezes/rales/rhonchi. Skin:  Skin is warm, dry and intact. No rash noted. Patient with a local area of erythema, and edema to the chin. A small central ulceration is noted.  ____________________________________________  PROCEDURES  Clindamycin 300 mg PO ____________________________________________  INITIAL IMPRESSION / ASSESSMENT AND PLAN / ED COURSE  Patient with a presentation consistent with an acute facial cellulitis. It is unclear whether infection was caused by a insect bite at this time. No signs of central necrosis or abscess formation is appreciated.  The patient is discharged at this time with a prescription for clindamycin to dose as directed. She is encouraged to apply warm compresses to promote healing. She should return to the ED as needed for wound check or see a provider [facility]. ____________________________________________  FINAL CLINICAL IMPRESSION(S) / ED DIAGNOSES  Final diagnoses:  Cellulitis of face      Lissa HoardJenise V Bacon Mir Fullilove,  PA-C 09/02/16 1619    Phineas SemenGraydon Goodman, MD 09/02/16 [phone]

## 2016-09-02 NOTE — ED Notes (Signed)
See triage note  States she woke up with redness and swelling noted to chin this am  Possible insect bite to chin this am   Afebrile on arrival

## 2016-09-02 NOTE — ED Triage Notes (Signed)
Woke up with redness/swelling to chin per pt. Feels like getting worse. Hurts and itches. "i can feel fever in it"

## 2016-09-02 NOTE — Discharge Instructions (Signed)
Take the antibiotic as directed. Keep the wound covered and apply warm compresses to promote healing. Follow-up with [facility] or return to the ED as needed.

## 2017-04-01 ENCOUNTER — Encounter: Payer: Self-pay | Admitting: *Deleted

## 2017-04-01 ENCOUNTER — Emergency Department: Payer: Self-pay

## 2017-04-01 ENCOUNTER — Emergency Department
Admission: EM | Admit: 2017-04-01 | Discharge: 2017-04-01 | Disposition: A | Discharge status: Home / Self Care | Payer: Self-pay | Attending: Student in an Organized Health Care Education/Training Program | Admitting: Student in an Organized Health Care Education/Training Program

## 2017-04-01 DIAGNOSIS — K649 Unspecified hemorrhoids: Secondary | ICD-10-CM

## 2017-04-01 DIAGNOSIS — F17210 Nicotine dependence, cigarettes, uncomplicated: Secondary | ICD-10-CM | POA: Insufficient documentation

## 2017-04-01 DIAGNOSIS — R1013 Epigastric pain: Secondary | ICD-10-CM | POA: Insufficient documentation

## 2017-04-01 DIAGNOSIS — R1110 Vomiting, unspecified: Secondary | ICD-10-CM | POA: Insufficient documentation

## 2017-04-01 LAB — CBC
HCT: 40.6 % (ref 35.0–47.0)
HEMOGLOBIN: 14.2 g/dL (ref 12.0–16.0)
MCH: 32.1 pg (ref 26.0–34.0)
MCHC: 34.9 g/dL (ref 32.0–36.0)
MCV: 92.2 fL (ref 80.0–100.0)
PLATELETS: 182 10*3/uL (ref 150–440)
RBC: 4.41 MIL/uL (ref 3.80–5.20)
RDW: 12.8 % (ref 11.5–14.5)
WBC: 7.1 10*3/uL (ref 3.6–11.0)

## 2017-04-01 LAB — COMPREHENSIVE METABOLIC PANEL
ALT: 8 U/L — AB (ref 14–54)
ANION GAP: 6 (ref 5–15)
AST: 16 U/L (ref 15–41)
Albumin: 4 g/dL (ref 3.5–5.0)
Alkaline Phosphatase: 55 U/L (ref 38–126)
BUN: 6 mg/dL (ref 6–20)
CALCIUM: 8.9 mg/dL (ref 8.9–10.3)
CHLORIDE: 105 mmol/L (ref 101–111)
CO2: 26 mmol/L (ref 22–32)
CREATININE: 0.71 mg/dL (ref 0.44–1.00)
Glucose, Bld: 124 mg/dL — ABNORMAL HIGH (ref 65–99)
Potassium: 3.8 mmol/L (ref 3.5–5.1)
SODIUM: 137 mmol/L (ref 135–145)
Total Bilirubin: 0.5 mg/dL (ref 0.3–1.2)
Total Protein: 7.2 g/dL (ref 6.5–8.1)

## 2017-04-01 LAB — TROPONIN I

## 2017-04-01 LAB — LIPASE, BLOOD: LIPASE: 33 U/L (ref 11–51)

## 2017-04-01 MED ORDER — RANITIDINE HCL 150 MG PO TABS: 150.0000 mg | ORAL_TABLET | Freq: Two times a day (BID) | ORAL | 1 refills | Status: DC

## 2017-04-01 MED ORDER — GI COCKTAIL ~~LOC~~
ORAL | Status: AC
  Administered 2017-04-01: 30 mL via ORAL
  Filled 2017-04-01: qty 30

## 2017-04-01 MED ORDER — HYDROCORTISONE ACETATE 25 MG RE SUPP: 25.0000 mg | Freq: Two times a day (BID) | RECTAL | 1 refills | Status: AC

## 2017-04-01 MED ORDER — PANTOPRAZOLE SODIUM 40 MG IV SOLR
40.0000 mg | Freq: Once | INTRAVENOUS | Status: AC
  Administered 2017-04-01: 40 mg via INTRAVENOUS
  Filled 2017-04-01: qty 40

## 2017-04-01 MED ORDER — PANTOPRAZOLE SODIUM 40 MG IV SOLR
INTRAVENOUS | Status: AC
  Administered 2017-04-01: 40 mg via INTRAVENOUS
  Filled 2017-04-01: qty 40

## 2017-04-01 MED ORDER — IOPAMIDOL (ISOVUE-300) INJECTION 61%
100.0000 mL | Freq: Once | INTRAVENOUS | Status: AC | PRN
  Administered 2017-04-01: 100 mL via INTRAVENOUS
  Filled 2017-04-01: qty 100

## 2017-04-01 MED ORDER — GI COCKTAIL ~~LOC~~
30.0000 mL | Freq: Once | ORAL | Status: AC
  Administered 2017-04-01: 30 mL via ORAL

## 2017-04-01 NOTE — ED Triage Notes (Signed)
States abd pain and vomiting and diarrhea for 2 days, states hx of a gastric ulcer

## 2017-04-01 NOTE — ED Notes (Signed)
Rectal examination done by MD. This RN present at bedside.

## 2017-04-01 NOTE — Discharge Instructions (Signed)

## 2017-04-01 NOTE — ED Provider Notes (Signed)
[facility] Emergency Department Provider Note    First MD Initiated Contact with Patient 04/01/17 1537     (approximate)  I have reviewed the triage vital signs and the nursing notes.   HISTORY  Chief Complaint Emesis and Abdominal Pain    HPI Claudia Cantu is a 46 y.o. female presents with 2 days of epigastric pain associated with diarrhea for the past 2 days. States she also has a history of gastric ulcer. Has had endoscopy before and has been taking Prilosec but does continue to smoke 1 pack of cigarettes per day. States the pain is in the epigastric region. Has had nonbilious nonbloody vomiting.   Past Medical History:  Diagnosis Date  . Anemia    History of anemia and menorrhagia, menorrhagia treated with NovaSure  . Hepatitis C    History reviewed. No pertinent family history. Past Surgical History:  Procedure Laterality Date  . CESAREAN SECTION    . NOVASURE ABLATION    . TUBAL LIGATION     Patient Active Problem List   Diagnosis Date Noted  . Tobacco abuse 05/29/2011  . Preventative health care 05/29/2011  . History of anemia 05/29/2011      Prior to Admission medications   Medication Sig Start Date End Date Taking? Authorizing Provider  hydrocortisone (ANUSOL-HC) 25 MG suppository Place 1 suppository (25 mg total) rectally every 12 (twelve) hours. 04/01/17 04/01/18  Willy Eddy, MD  ranitidine (ZANTAC) 150 MG tablet Take 1 tablet (150 mg total) by mouth 2 (two) times daily. 04/01/17 04/01/18  Willy Eddy, MD    Allergies Penicillins and Codeine    Social History Social History  Substance Use Topics  . Smoking status: Current Every Day Smoker    Packs/day: 1.00    Types: Cigarettes  . Smokeless tobacco: Not on file  . Alcohol use No    Review of Systems Patient denies headaches, rhinorrhea, blurry vision, numbness, shortness of breath, chest pain, edema, cough, abdominal pain, nausea, vomiting, diarrhea,  dysuria, fevers, rashes or hallucinations unless otherwise stated above in HPI. ____________________________________________   PHYSICAL EXAM:  VITAL SIGNS: Vitals:   04/01/17 1458  BP: (!) 153/95  Pulse: 75  Resp: 18  Temp: 97.6 F (36.4 C)  SpO2: 100%    Constitutional: Alert and oriented.  in no acute distress. Eyes: Conjunctivae are normal.  Head: Atraumatic. Nose: No congestion/rhinnorhea. Mouth/Throat: Mucous membranes are moist.   Neck: No stridor. Painless ROM.  Cardiovascular: Normal rate, regular rhythm. Grossly normal heart sounds.  Good peripheral circulation. Respiratory: Normal respiratory effort.  No retractions. Lungs CTAB. Gastrointestinal: Soft and nontender. No distention. No abdominal bruits. No CVA tenderness. Genitourinary: small non thrombosed hemorrhoid.  No blood in rectal vault, no melena, no mass, guaiac negative Musculoskeletal: No lower extremity tenderness nor edema.  No joint effusions. Neurologic:  Normal speech and language. No gross focal neurologic deficits are appreciated. No facial droop Skin:  Skin is warm, dry and intact. No rash noted. Psychiatric: Mood and affect are normal. Speech and behavior are normal.  ____________________________________________   LABS (all labs ordered are listed, but only abnormal results are displayed)  Results for orders placed or performed during the hospital encounter of 04/01/17 (from the past 24 hour(s))  Lipase, blood     Status: None   Collection Time: 04/01/17  2:58 PM  Result Value Ref Range   Lipase 33 11 - 51 U/L  Comprehensive metabolic panel     Status: Abnormal  Collection Time: 04/01/17  2:58 PM  Result Value Ref Range   Sodium 137 135 - 145 mmol/L   Potassium 3.8 3.5 - 5.1 mmol/L   Chloride 105 101 - 111 mmol/L   CO2 26 22 - 32 mmol/L   Glucose, Bld 124 (H) 65 - 99 mg/dL   BUN 6 6 - 20 mg/dL   Creatinine, Ser 1.61 0.44 - 1.00 mg/dL   Calcium 8.9 8.9 - 09.6 mg/dL   Total Protein 7.2  6.5 - 8.1 g/dL   Albumin 4.0 3.5 - 5.0 g/dL   AST 16 15 - 41 U/L   ALT 8 (L) 14 - 54 U/L   Alkaline Phosphatase 55 38 - 126 U/L   Total Bilirubin 0.5 0.3 - 1.2 mg/dL   GFR calc non Af Amer >60 >60 mL/min   GFR calc Af Amer >60 >60 mL/min   Anion gap 6 5 - 15  CBC     Status: None   Collection Time: 04/01/17  2:58 PM  Result Value Ref Range   WBC 7.1 3.6 - 11.0 K/uL   RBC 4.41 3.80 - 5.20 MIL/uL   Hemoglobin 14.2 12.0 - 16.0 g/dL   HCT 04.5 40.9 - 81.1 %   MCV 92.2 80.0 - 100.0 fL   MCH 32.1 26.0 - 34.0 pg   MCHC 34.9 32.0 - 36.0 g/dL   RDW 91.4 78.2 - 95.6 %   Platelets 182 150 - 440 K/uL  Troponin I     Status: None   Collection Time: 04/01/17  2:58 PM  Result Value Ref Range   Troponin I <0.03 <0.03 ng/mL   ____________________________________________  EKG My review and personal interpretation at Time: 17:06   Indication: epigastric pain  Rate: 55  Rhythm: sinus Axis: normal Other: normal intervals, no stemi, no depressions, sinus dysrhythmia ____________________________________________  RADIOLOGY  I personally reviewed all radiographic images ordered to evaluate for the above acute complaints and reviewed radiology reports and findings.  These findings were personally discussed with the patient.  Please see medical record for radiology report.  ____________________________________________   PROCEDURES  Procedure(s) performed:  Procedures    Critical Care performed: no ____________________________________________   INITIAL IMPRESSION / ASSESSMENT AND PLAN / ED COURSE  Pertinent labs & imaging results that were available during my care of the patient were reviewed by me and considered in my medical decision making (see chart for details).  DDX: gastritis, ulcer, ugib, lgib, hemorrhoid, acs, enteritis  Claudia Cantu is a 46 y.o. who presents to the ED with Epigastric pain diarrhea and concern for blood in her stools over the past 2-3 days. Patient  afebrile hemodynamic stable. Abdominal exam does have some mild tenderness therefore CT imaging ordered for the above differential shows no evidence of perforation biliary pathology or cirrhosis. Patient is guaiac negative and her hemoglobin is stable. I do not feel this is clinically consistent with significant upper or lower GI bleed. More consistent with acute gastritis. Patient's Prilosec will be increased patient will be given referral to GI. Discussed signs and symptoms for which she should return immediately.  Have discussed with the patient and available family all diagnostics and treatments performed thus far and all questions were answered to the best of my ability. The patient demonstrates understanding and agreement with plan.       ____________________________________________   FINAL CLINICAL IMPRESSION(S) / ED DIAGNOSES  Final diagnoses:  Acute epigastric pain      NEW MEDICATIONS STARTED DURING THIS  VISIT:  New Prescriptions   HYDROCORTISONE (ANUSOL-HC) 25 MG SUPPOSITORY    Place 1 suppository (25 mg total) rectally every 12 (twelve) hours.   RANITIDINE (ZANTAC) 150 MG TABLET    Take 1 tablet (150 mg total) by mouth 2 (two) times daily.     Note:  This document was prepared using Dragon voice recognition software and may include unintentional dictation errors.    Willy Eddyobinson, Meliah Appleman, MD 04/01/17 [phone]

## 2018-06-25 ENCOUNTER — Other Ambulatory Visit: Payer: Self-pay

## 2018-06-25 ENCOUNTER — Emergency Department
Admission: EM | Admit: 2018-06-25 | Discharge: 2018-06-25 | Disposition: A | Discharge status: Home / Self Care | Payer: Self-pay | Attending: Emergency Medicine | Admitting: Emergency Medicine

## 2018-06-25 DIAGNOSIS — B029 Zoster without complications: Secondary | ICD-10-CM | POA: Insufficient documentation

## 2018-06-25 DIAGNOSIS — F17210 Nicotine dependence, cigarettes, uncomplicated: Secondary | ICD-10-CM | POA: Insufficient documentation

## 2018-06-25 MED ORDER — ACYCLOVIR 200 MG PO CAPS: 200.0000 mg | ORAL_CAPSULE | Freq: Once | ORAL | Status: DC

## 2018-06-25 MED ORDER — GABAPENTIN 600 MG PO TABS
300.0000 mg | ORAL_TABLET | Freq: Once | ORAL | Status: AC
  Administered 2018-06-25: 300 mg via ORAL
  Filled 2018-06-25: qty 0.5

## 2018-06-25 MED ORDER — ACYCLOVIR 200 MG PO CAPS
800.0000 mg | ORAL_CAPSULE | Freq: Once | ORAL | Status: AC
  Administered 2018-06-25: 800 mg via ORAL
  Filled 2018-06-25: qty 4

## 2018-06-25 MED ORDER — OXYCODONE HCL 5 MG PO TABS
5.0000 mg | ORAL_TABLET | Freq: Once | ORAL | Status: AC
  Administered 2018-06-25: 5 mg via ORAL
  Filled 2018-06-25: qty 1

## 2018-06-25 MED ORDER — GABAPENTIN 300 MG PO CAPS: 300.0000 mg | ORAL_CAPSULE | Freq: Three times a day (TID) | ORAL | 0 refills | Status: DC

## 2018-06-25 MED ORDER — OXYCODONE HCL 5 MG PO TABS: 5.0000 mg | ORAL_TABLET | Freq: Four times a day (QID) | ORAL | 0 refills | Status: AC | PRN

## 2018-06-25 MED ORDER — ACYCLOVIR 800 MG PO TABS: 800.0000 mg | ORAL_TABLET | Freq: Every day | ORAL | 0 refills | Status: AC

## 2018-06-25 NOTE — ED Provider Notes (Signed)
[facility] Emergency Department Provider Note  ____________________________________________  Time seen: Approximately 9:01 PM  I have reviewed the triage vital signs and the nursing notes.   HISTORY  Chief Complaint Rash    HPI Claudia Cantu is a 47 y.o. female presents the emergency department complaining of painful, blistering rash extending from the right hand up the right arm.  Patient also has 3 other skin lesions that she is concerned may be related to the left upper extremity, buttocks region.  Patient reports that the lesions to the right are extremely painful, described as burning.  No trauma to the area.  Patient is unsure whether she had chickenpox as a child.  Patient states that the other 3 lesions are nonpainful and she did not notice them until she appreciated the rash to the right hand and arm.  No medications for this complaint.  Patient denies any fevers or chills, chest pain, shortness of breath, abdominal pain, nausea or vomiting.  No history of shingles.  Patient does have a history of anemia in the past as well as hepatitis C.  Patient is unable to take Tylenol.    Past Medical History:  Diagnosis Date  . Anemia    History of anemia and menorrhagia, menorrhagia treated with NovaSure  . Hepatitis C     Patient Active Problem List   Diagnosis Date Noted  . Tobacco abuse 05/29/2011  . Preventative health care 05/29/2011  . History of anemia 05/29/2011    Past Surgical History:  Procedure Laterality Date  . CESAREAN SECTION    . NOVASURE ABLATION    . TUBAL LIGATION      Prior to Admission medications   Medication Sig Start Date End Date Taking? Authorizing Provider  acyclovir (ZOVIRAX) 800 MG tablet Take 1 tablet (800 mg total) by mouth 5 (five) times daily for 10 days. 06/25/18 07/05/18  Cuthriell, Delorise Royals, PA-C  gabapentin (NEURONTIN) 300 MG capsule Take 1 capsule (300 mg total) by mouth 3 (three) times daily. 06/25/18  06/25/19  Cuthriell, Delorise Royals, PA-C  oxyCODONE (ROXICODONE) 5 MG immediate release tablet Take 1 tablet (5 mg total) by mouth every 6 (six) hours as needed. 06/25/18 06/25/19  Cuthriell, Delorise Royals, PA-C  ranitidine (ZANTAC) 150 MG tablet Take 1 tablet (150 mg total) by mouth 2 (two) times daily. 04/01/17 04/01/18  Willy Eddy, MD    Allergies Penicillins and Codeine  No family history on file.  Social History Social History   Tobacco Use  . Smoking status: Current Every Day Smoker    Packs/day: 1.00    Types: Cigarettes  . Smokeless tobacco: Never Used  Substance Use Topics  . Alcohol use: No  . Drug use: No     Review of Systems  Constitutional: No fever/chills Eyes: No visual changes. No discharge ENT: No upper respiratory complaints. Cardiovascular: no chest pain. Respiratory: no cough. No SOB. Gastrointestinal: No abdominal pain.  No nausea, no vomiting.   Musculoskeletal: Negative for musculoskeletal pain. Skin: Positive for blistering rash to the right hand extending up the right arm. Neurological: Negative for headaches, focal weakness or numbness. 10-point ROS otherwise negative.  ____________________________________________   PHYSICAL EXAM:  VITAL SIGNS: ED Triage Vitals [06/25/18 2038]  Enc Vitals Group     BP 131/60     Pulse Rate 62     Resp 17     Temp 97.6 F (36.4 C)     Temp Source Oral     SpO2 100 %  Weight 138 lb (62.6 kg)     Height 5' (1.524 m)     Head Circumference      Peak Flow      Pain Score 8     Pain Loc      Pain Edu?      Excl. in GC?      Constitutional: Alert and oriented. Well appearing and in no acute distress. Eyes: Conjunctivae are normal. PERRL. EOMI. Head: Atraumatic. Neck: No stridor.    Cardiovascular: Normal rate, regular rhythm. Normal S1 and S2.  Good peripheral circulation. Respiratory: Normal respiratory effort without tachypnea or retractions. Lungs CTAB. Good air entry to the bases with no  decreased or absent breath sounds. Musculoskeletal: Full range of motion to all extremities. No gross deformities appreciated. Neurologic:  Normal speech and language. No gross focal neurologic deficits are appreciated.  Skin:  Skin is warm, dry and intact.  Patient with vesicular rash noted extending from the second and third digits, along the dorsal hand, up the medial forearm.  Rash extends along dermatomal distributions.  Areas are tender to palpation.  No erythema or edema consistent with cellulitis.  Rash is consistent with herpes zoster infection.  Patient has 3 other lesions noted to the left forearm, buttocks.  These are macular rash consistent with bug bite/contact dermatitis. Psychiatric: Mood and affect are normal. Speech and behavior are normal. Patient exhibits appropriate insight and judgement.   ____________________________________________   LABS (all labs ordered are listed, but only abnormal results are displayed)  Labs Reviewed - No data to display ____________________________________________  EKG   ____________________________________________  RADIOLOGY   No results found.  ____________________________________________    PROCEDURES  Procedure(s) performed:    Procedures    Medications  oxyCODONE (Oxy IR/ROXICODONE) immediate release tablet 5 mg (has no administration in time range)  gabapentin (NEURONTIN) tablet 300 mg (has no administration in time range)  acyclovir (ZOVIRAX) 200 MG capsule 800 mg (has no administration in time range)     ____________________________________________   INITIAL IMPRESSION / ASSESSMENT AND PLAN / ED COURSE  Pertinent labs & imaging results that were available during my care of the patient were reviewed by me and considered in my medical decision making (see chart for details).  Review of the [place] CSRS was performed in accordance of the [facility] prior to dispensing any controlled drugs.      Patient's diagnosis is  consistent with herpes zoster infection.  Patient presents the emergency department with a burning vesicular rash extending up the right hand, forearm.  Rashes consistent with shingles.  Patient will be started on antiviral medication, gabapentin, pain medication.  I discussed ongoing treatment options with patient.  Follow-up with primary care.  Patient is given ED precautions to return to the ED for any worsening or new symptoms.     ____________________________________________  FINAL CLINICAL IMPRESSION(S) / ED DIAGNOSES  Final diagnoses:  Herpes zoster without complication      NEW MEDICATIONS STARTED DURING THIS VISIT:  ED Discharge Orders         Ordered    acyclovir (ZOVIRAX) 800 MG tablet  5 times daily     06/25/18 2129    oxyCODONE (ROXICODONE) 5 MG immediate release tablet  Every 6 hours PRN     06/25/18 2129    gabapentin (NEURONTIN) 300 MG capsule  3 times daily     06/25/18 2129              This chart was dictated  using voice recognition software/Dragon. Despite best efforts to proofread, errors can occur which can change the meaning. Any change was purely unintentional.    Racheal Patches, PA-C 06/25/18 2130    Arnaldo Natal, MD 06/26/18 [phone]

## 2018-06-25 NOTE — ED Triage Notes (Signed)
Pt arrives to ED via POV from home with c/o generalized rash x1 day. Pt states she noticed the s/x's today starting on her right hand, but has since spread to other areas. Pt states they "burn and itch". No fever, no N/V/D.

## 2018-09-29 ENCOUNTER — Encounter: Payer: Self-pay | Admitting: *Deleted

## 2018-09-29 ENCOUNTER — Ambulatory Visit: Payer: Self-pay | Attending: Oncology

## 2018-10-06 ENCOUNTER — Encounter: Payer: Self-pay | Admitting: *Deleted

## 2018-10-06 ENCOUNTER — Ambulatory Visit: Payer: Self-pay | Attending: Oncology

## 2020-04-12 ENCOUNTER — Inpatient Hospital Stay
Admission: EM | Admit: 2020-04-12 | Discharge: 2020-04-15 | DRG: 377 | Disposition: A | Discharge status: Home / Self Care | Payer: Self-pay | Attending: Internal Medicine | Admitting: Internal Medicine

## 2020-04-12 ENCOUNTER — Encounter: Payer: Self-pay | Admitting: Radiology

## 2020-04-12 ENCOUNTER — Emergency Department: Payer: Self-pay

## 2020-04-12 DIAGNOSIS — K250 Acute gastric ulcer with hemorrhage: Principal | ICD-10-CM | POA: Diagnosis present

## 2020-04-12 DIAGNOSIS — K253 Acute gastric ulcer without hemorrhage or perforation: Secondary | ICD-10-CM

## 2020-04-12 DIAGNOSIS — K922 Gastrointestinal hemorrhage, unspecified: Secondary | ICD-10-CM | POA: Diagnosis present

## 2020-04-12 DIAGNOSIS — B1920 Unspecified viral hepatitis C without hepatic coma: Secondary | ICD-10-CM | POA: Diagnosis present

## 2020-04-12 DIAGNOSIS — Z880 Allergy status to penicillin: Secondary | ICD-10-CM

## 2020-04-12 DIAGNOSIS — Z885 Allergy status to narcotic agent status: Secondary | ICD-10-CM

## 2020-04-12 DIAGNOSIS — R519 Headache, unspecified: Secondary | ICD-10-CM | POA: Diagnosis present

## 2020-04-12 DIAGNOSIS — K529 Noninfective gastroenteritis and colitis, unspecified: Secondary | ICD-10-CM

## 2020-04-12 DIAGNOSIS — R578 Other shock: Secondary | ICD-10-CM | POA: Diagnosis present

## 2020-04-12 DIAGNOSIS — T391X1A Poisoning by 4-Aminophenol derivatives, accidental (unintentional), initial encounter: Secondary | ICD-10-CM | POA: Diagnosis present

## 2020-04-12 DIAGNOSIS — H547 Unspecified visual loss: Secondary | ICD-10-CM | POA: Diagnosis present

## 2020-04-12 DIAGNOSIS — F17210 Nicotine dependence, cigarettes, uncomplicated: Secondary | ICD-10-CM | POA: Diagnosis present

## 2020-04-12 DIAGNOSIS — A09 Infectious gastroenteritis and colitis, unspecified: Secondary | ICD-10-CM | POA: Diagnosis present

## 2020-04-12 DIAGNOSIS — K2901 Acute gastritis with bleeding: Secondary | ICD-10-CM

## 2020-04-12 DIAGNOSIS — F1320 Sedative, hypnotic or anxiolytic dependence, uncomplicated: Secondary | ICD-10-CM | POA: Diagnosis present

## 2020-04-12 DIAGNOSIS — Z79899 Other long term (current) drug therapy: Secondary | ICD-10-CM

## 2020-04-12 DIAGNOSIS — K08109 Complete loss of teeth, unspecified cause, unspecified class: Secondary | ICD-10-CM | POA: Diagnosis present

## 2020-04-12 DIAGNOSIS — K921 Melena: Secondary | ICD-10-CM

## 2020-04-12 DIAGNOSIS — Z20822 Contact with and (suspected) exposure to covid-19: Secondary | ICD-10-CM | POA: Diagnosis present

## 2020-04-12 DIAGNOSIS — G8929 Other chronic pain: Secondary | ICD-10-CM

## 2020-04-12 LAB — COMPREHENSIVE METABOLIC PANEL
ALT: 11 U/L (ref 0–44)
AST: 22 U/L (ref 15–41)
Albumin: 3.5 g/dL (ref 3.5–5.0)
Alkaline Phosphatase: 48 U/L (ref 38–126)
Anion gap: 13 (ref 5–15)
BUN: 33 mg/dL — ABNORMAL HIGH (ref 6–20)
CO2: 20 mmol/L — ABNORMAL LOW (ref 22–32)
Calcium: 8.3 mg/dL — ABNORMAL LOW (ref 8.9–10.3)
Chloride: 107 mmol/L (ref 98–111)
Creatinine, Ser: 0.88 mg/dL (ref 0.44–1.00)
GFR calc Af Amer: >60 mL/min (ref >60)
GFR calc non Af Amer: >60 mL/min (ref >60)
Glucose, Bld: 238 mg/dL — ABNORMAL HIGH (ref 70–99)
Potassium: 3.4 mmol/L — ABNORMAL LOW (ref 3.5–5.1)
Sodium: 140 mmol/L (ref 135–145)
Total Bilirubin: 0.4 mg/dL (ref 0.3–1.2)
Total Protein: 6.1 g/dL — ABNORMAL LOW (ref 6.5–8.1)

## 2020-04-12 LAB — CBC WITH DIFFERENTIAL/PLATELET
Abs Immature Granulocytes: 0.1 10*3/uL — ABNORMAL HIGH (ref 0.00–0.07)
Basophils Absolute: 0 10*3/uL (ref 0.0–0.1)
Basophils Relative: 0 %
Eosinophils Absolute: 0 10*3/uL (ref 0.0–0.5)
Eosinophils Relative: 0 %
HCT: 27.6 % — ABNORMAL LOW (ref 36.0–46.0)
Hemoglobin: 9.4 g/dL — ABNORMAL LOW (ref 12.0–15.0)
Immature Granulocytes: 1 %
Lymphocytes Relative: 12 %
Lymphs Abs: 1.9 10*3/uL (ref 0.7–4.0)
MCH: 29.9 pg (ref 26.0–34.0)
MCHC: 34.1 g/dL (ref 30.0–36.0)
MCV: 87.9 fL (ref 80.0–100.0)
Monocytes Absolute: 0.5 10*3/uL (ref 0.1–1.0)
Monocytes Relative: 3 %
Neutro Abs: 13.3 10*3/uL — ABNORMAL HIGH (ref 1.7–7.7)
Neutrophils Relative %: 84 %
Platelets: 344 10*3/uL (ref 150–400)
RBC: 3.14 MIL/uL — ABNORMAL LOW (ref 3.87–5.11)
RDW: 12.8 % (ref 11.5–15.5)
WBC: 15.8 10*3/uL — ABNORMAL HIGH (ref 4.0–10.5)
nRBC: 0 % (ref 0.0–0.2)

## 2020-04-12 LAB — PROTIME-INR
INR: 1.2 (ref 0.8–1.2)
Prothrombin Time: 15.1 seconds (ref 11.4–15.2)

## 2020-04-12 LAB — ETHANOL: Alcohol, Ethyl (B): <10 mg/dL (ref <10)

## 2020-04-12 LAB — LIPASE, BLOOD: Lipase: 22 U/L (ref 11–51)

## 2020-04-12 MED ORDER — PANTOPRAZOLE SODIUM 40 MG IV SOLR
80.0000 mg | Freq: Once | INTRAVENOUS | Status: AC
  Administered 2020-04-12: 80 mg via INTRAVENOUS
  Filled 2020-04-12: qty 80

## 2020-04-12 MED ORDER — FENTANYL CITRATE (PF) 100 MCG/2ML IJ SOLN
50.0000 ug | Freq: Once | INTRAMUSCULAR | Status: AC
  Administered 2020-04-12: 50 ug via INTRAVENOUS
  Filled 2020-04-12: qty 2

## 2020-04-12 MED ORDER — IOHEXOL 350 MG/ML SOLN
100.0000 mL | Freq: Once | INTRAVENOUS | Status: AC | PRN
  Administered 2020-04-13: 100 mL via INTRAVENOUS

## 2020-04-12 MED ORDER — SODIUM CHLORIDE 0.9 % IV SOLN
8.0000 mg/h | INTRAVENOUS | Status: DC
  Administered 2020-04-13 – 2020-04-14 (×3): 8 mg/h via INTRAVENOUS
  Filled 2020-04-12 (×4): qty 80

## 2020-04-12 MED ORDER — SODIUM CHLORIDE 0.9 % IV BOLUS
1000.0000 mL | Freq: Once | INTRAVENOUS | Status: AC
  Administered 2020-04-12: 1000 mL via INTRAVENOUS

## 2020-04-12 MED ORDER — SODIUM CHLORIDE 0.9 % IV SOLN: 10.0000 mL/h | Freq: Once | INTRAVENOUS | Status: DC

## 2020-04-12 MED ORDER — FENTANYL CITRATE (PF) 100 MCG/2ML IJ SOLN
INTRAMUSCULAR | Status: AC
Start: 2020-04-12 — End: 2020-04-13
  Filled 2020-04-12: qty 2

## 2020-04-12 MED ORDER — METOCLOPRAMIDE HCL 5 MG/ML IJ SOLN
10.0000 mg | Freq: Once | INTRAMUSCULAR | Status: AC
  Administered 2020-04-12: 10 mg via INTRAVENOUS
  Filled 2020-04-12: qty 2

## 2020-04-12 MED ORDER — ONDANSETRON HCL 4 MG/2ML IJ SOLN
4.0000 mg | Freq: Once | INTRAMUSCULAR | Status: AC
  Administered 2020-04-12: 4 mg via INTRAVENOUS
  Filled 2020-04-12: qty 2

## 2020-04-12 NOTE — ED Notes (Signed)
Pt continues to refuse visitors but gave this RN verbal consent to update daughter via phone.

## 2020-04-12 NOTE — ED Provider Notes (Signed)
[facility] Emergency Department Provider Note  ____________________________________________  Time seen: Approximately 10:44 PM  I have reviewed the triage vital signs and the nursing notes.   HISTORY  Chief Complaint Hematemesis    Level 5 Caveat: Portions of the History and Physical including HPI and review of systems are unable to be completely obtained due to patient being a poor historian   HPI Claudia Cantu is a 49 y.o. female with a history of anemia, hepatitis C, peptic ulcer disease with UGIB in 2015 who was brought to the ED due to vomiting blood at home. She had multiple episodes of hematemesis at home, noted to be bright red blood. EMS also reported melanotic stool on the scene. No blood thinners or known liver disease. She does have a history of bleeding from peptic ulcer disease.  Review of EMR shows that she was admitted for upper GI bleed in 2015.     Past Medical History:  Diagnosis Date  . Anemia    History of anemia and menorrhagia, menorrhagia treated with NovaSure  . Hepatitis C      Patient Active Problem List   Diagnosis Date Noted  . Tobacco abuse 05/29/2011  . Preventative health care 05/29/2011  . History of anemia 05/29/2011     Past Surgical History:  Procedure Laterality Date  . CESAREAN SECTION    . NOVASURE ABLATION    . TUBAL LIGATION       Prior to Admission medications   Medication Sig Start Date End Date Taking? Authorizing Provider  gabapentin (NEURONTIN) 300 MG capsule Take 1 capsule (300 mg total) by mouth 3 (three) times daily. 06/25/18 06/25/19  Cuthriell, Delorise Royals, PA-C  ranitidine (ZANTAC) 150 MG tablet Take 1 tablet (150 mg total) by mouth 2 (two) times daily. 04/01/17 04/01/18  Willy Eddy, MD     Allergies Penicillins and Codeine   No family history on file.  Social History Social History   Tobacco Use  . Smoking status: Current Every Day Smoker    Packs/day: 1.00    Types:  Cigarettes  . Smokeless tobacco: Never Used  Substance Use Topics  . Alcohol use: No  . Drug use: No    Review of Systems Level 5 Caveat: Portions of the History and Physical including HPI and review of systems are unable to be completely obtained due to patient being a poor historian   Constitutional:   No known fever.  ENT:   No rhinorrhea. Cardiovascular:   No chest pain or syncope. Respiratory:   No dyspnea or cough. Gastrointestinal: Positive hematemesis and melanotic stool Musculoskeletal:   Negative for focal pain or swelling ____________________________________________   PHYSICAL EXAM:  VITAL SIGNS: ED Triage Vitals [04/12/20 2241]  Enc Vitals Group     BP 120/78     Pulse Rate (!) 154     Resp (!) 25     Temp      Temp src      SpO2 100 %     Weight      Height      Head Circumference      Peak Flow      Pain Score      Pain Loc      Pain Edu?      Excl. in GC?     Vital signs reviewed, nursing assessments reviewed.   Constitutional: Awake and alert, not oriented, ill-appearing Eyes:   Conjunctivae are pale. EOMI. PERRL. ENT  Head:   Normocephalic and atraumatic.      Nose:   No congestion/rhinnorhea.       Mouth/Throat:   Dry mucosa, no pharyngeal erythema. No peritonsillar mass.       Neck:   No meningismus. Full ROM. Hematological/Lymphatic/Immunilogical:   No cervical lymphadenopathy. Cardiovascular:   Tachycardia heart rate 140. Symmetric bilateral radial and DP pulses.  No murmurs. Cap refill less than 2 seconds. Respiratory:   Normal respiratory effort without tachypnea/retractions. Breath sounds are clear and equal bilaterally. No wheezes/rales/rhonchi. Gastrointestinal:   Soft with generalized tenderness. Non distended. There is no CVA tenderness.  No rebound, rigidity, or guarding. Rectal exam shows gross blood  Musculoskeletal:   Normal range of motion in all extremities. No joint effusions.  No lower extremity tenderness.  No  edema. Neurologic:   Normal speech and language.  Motor grossly intact. No acute focal neurologic deficits are appreciated.  Skin:    Skin is warm, dry and intact. No rash noted.  No petechiae, purpura, or bullae.  ____________________________________________    LABS (pertinent positives/negatives) (all labs ordered are listed, but only abnormal results are displayed) Labs Reviewed  COMPREHENSIVE METABOLIC PANEL - Abnormal; Notable for the following components:      Result Value   Potassium 3.4 (*)    CO2 20 (*)    Glucose, Bld 238 (*)    BUN 33 (*)    Calcium 8.3 (*)    Total Protein 6.1 (*)    All other components within normal limits  CBC WITH DIFFERENTIAL/PLATELET - Abnormal; Notable for the following components:   WBC 15.8 (*)    RBC 3.14 (*)    Hemoglobin 9.4 (*)    HCT 27.6 (*)    Neutro Abs 13.3 (*)    Abs Immature Granulocytes 0.10 (*)    All other components within normal limits  SARS CORONAVIRUS 2 BY RT PCR (HOSPITAL ORDER, PERFORMED IN [facility])  ETHANOL  LIPASE, BLOOD  PROTIME-INR  TYPE AND SCREEN  PREPARE RBC (CROSSMATCH)   ____________________________________________   EKG    ____________________________________________    RADIOLOGY  No results found.  ____________________________________________   PROCEDURES .Critical Care Performed by: Sharman Cheek, MD Authorized by: Sharman Cheek, MD   Critical care provider statement:    Critical care time (minutes):  35   Critical care time was exclusive of:  Separately billable procedures and treating other patients   Critical care was necessary to treat or prevent imminent or life-threatening deterioration of the following conditions:  Shock and circulatory failure   Critical care was time spent personally by me on the following activities:  Development of treatment plan with patient or surrogate, discussions with consultants, evaluation of patient's response to treatment,  examination of patient, obtaining history from patient or surrogate, ordering and performing treatments and interventions, ordering and review of laboratory studies, ordering and review of radiographic studies, pulse oximetry, re-evaluation of patient's condition and review of old charts    ____________________________________________    CLINICAL IMPRESSION / ASSESSMENT AND PLAN / ED COURSE  Medications ordered in the ED: Medications  0.9 %  sodium chloride infusion (has no administration in time range)  pantoprazole (PROTONIX) 80 mg in sodium chloride 0.9 % 100 mL (0.8 mg/mL) infusion (has no administration in time range)  sodium chloride 0.9 % bolus 1,000 mL (1,000 mLs Intravenous New Bag/Given 04/12/20 2250)  pantoprazole (PROTONIX) 80 mg in sodium chloride 0.9 % 100 mL IVPB (0 mg Intravenous Stopped 04/12/20  2321)  metoCLOPramide (REGLAN) injection 10 mg (10 mg Intravenous Given 04/12/20 2259)  ondansetron (ZOFRAN) injection 4 mg (4 mg Intravenous Given 04/12/20 2259)  fentaNYL (SUBLIMAZE) injection 50 mcg (50 mcg Intravenous Given by Other 04/12/20 2321)    Pertinent labs & imaging results that were available during my care of the patient were reviewed by me and considered in my medical decision making (see chart for details).   Claudia Cantu was evaluated in Emergency Department on 04/12/2020 for the symptoms described in the history of present illness. She was evaluated in the context of the global COVID-19 pandemic, which necessitated consideration that the patient might be at risk for infection with the SARS-CoV-2 virus that causes COVID-19. Institutional protocols and algorithms that pertain to the evaluation of patients at risk for COVID-19 are in a state of rapid change based on information released by regulatory bodies including the CDC and federal and state organizations. These policies and algorithms were followed during the patient's care in the ED.   Patient presents with  upper GI bleed and hematemesis.  She is tachycardic, pale, thready pulse, clinically in hemorrhagic shock despite normal blood pressure.  I contacted the blood bank for emergency release blood transfusion, 2 units O-.  Will check labs, give IV Protonix and IV fluids.  Will need to be admitted.  ----------------------------------------- 11:42 PM on 04/12/2020 -----------------------------------------  Patient calm after receiving fentanyl 50 mcg IV. After 1 unit red cells, tachycardia has improved to 100, blood pressure is 150/100. Protonix infusion in place. I discussed the case with GI Dr. Allegra Lai who also recommended obtaining CT angiogram of the abdomen.    Care signed out to Dr. Don Perking pending CT scan and disposition.       ____________________________________________   FINAL CLINICAL IMPRESSION(S) / ED DIAGNOSES    Final diagnoses:  Acute upper GI bleed  Hemorrhagic shock [facility])     ED Discharge Orders    None      Portions of this note were generated with dragon dictation software. Dictation errors may occur despite best attempts at proofreading.   Sharman Cheek, MD 04/12/20 [phone]

## 2020-04-12 NOTE — ED Notes (Signed)
Daughter updated via phone.

## 2020-04-12 NOTE — ED Triage Notes (Signed)
Pt to ED via EMS from home with vomiting x 12 hours that has progressively become worse over the past hour. Per Family emesis has been bright red in color and upon rectal assessment stool is bright red as well. Pt is pale upon arrival and appears altered yelling out for help. No known hx of blood thinners or liver disease. Hx of gastric ulcers that have "burst" in the past per family.   EMS vitals:  106 systolic trending to 90 systolic for EMS 41-03% on RA 25 RR 97.7 oral  273 CBG 20-25 CO2

## 2020-04-12 NOTE — ED Notes (Signed)
Pt combative and yelling about abd pain and a bowel movement. Pt refusing to allow RN to clean her at this time and refusing to have family back to room to visit. Pt is yelling but able to follow commands. Multiple warm blankets applied after pt continued yelling about being cold.

## 2020-04-12 NOTE — ED Notes (Signed)
Pt has calmed down after pain medication. Resting in bed at this time. RN remains at bedside.

## 2020-04-13 DIAGNOSIS — Z8711 Personal history of peptic ulcer disease: Secondary | ICD-10-CM | POA: Insufficient documentation

## 2020-04-13 DIAGNOSIS — K922 Gastrointestinal hemorrhage, unspecified: Secondary | ICD-10-CM | POA: Diagnosis present

## 2020-04-13 LAB — TYPE AND SCREEN
ABO/RH(D): O POS
Antibody Screen: NEGATIVE
Unit division: 0
Unit division: 0

## 2020-04-13 LAB — BASIC METABOLIC PANEL
Anion gap: 8 (ref 5–15)
BUN: 25 mg/dL — ABNORMAL HIGH (ref 6–20)
CO2: 24 mmol/L (ref 22–32)
Calcium: 7.8 mg/dL — ABNORMAL LOW (ref 8.9–10.3)
Chloride: 106 mmol/L (ref 98–111)
Creatinine, Ser: 0.66 mg/dL (ref 0.44–1.00)
GFR calc Af Amer: >60 mL/min (ref >60)
GFR calc non Af Amer: >60 mL/min (ref >60)
Glucose, Bld: 123 mg/dL — ABNORMAL HIGH (ref 70–99)
Potassium: 3.8 mmol/L (ref 3.5–5.1)
Sodium: 138 mmol/L (ref 135–145)

## 2020-04-13 LAB — PREPARE RBC (CROSSMATCH)

## 2020-04-13 LAB — CBC
HCT: 31 % — ABNORMAL LOW (ref 36.0–46.0)
Hemoglobin: 10.8 g/dL — ABNORMAL LOW (ref 12.0–15.0)
MCH: 30.5 pg (ref 26.0–34.0)
MCHC: 34.8 g/dL (ref 30.0–36.0)
MCV: 87.6 fL (ref 80.0–100.0)
Platelets: 138 10*3/uL — ABNORMAL LOW (ref 150–400)
RBC: 3.54 MIL/uL — ABNORMAL LOW (ref 3.87–5.11)
RDW: 14.3 % (ref 11.5–15.5)
WBC: 12.5 10*3/uL — ABNORMAL HIGH (ref 4.0–10.5)
nRBC: 0 % (ref 0.0–0.2)

## 2020-04-13 LAB — URINALYSIS, COMPLETE (UACMP) WITH MICROSCOPIC
Bacteria, UA: NONE SEEN
Bilirubin Urine: NEGATIVE
Glucose, UA: NEGATIVE mg/dL
Ketones, ur: NEGATIVE mg/dL
Leukocytes,Ua: NEGATIVE
Nitrite: NEGATIVE
Protein, ur: NEGATIVE mg/dL
Specific Gravity, Urine: 1.032 — ABNORMAL HIGH (ref 1.005–1.030)
pH: 5 (ref 5.0–8.0)

## 2020-04-13 LAB — BPAM RBC
Blood Product Expiration Date: 202110052359
Blood Product Expiration Date: 202110052359
ISSUE DATE / TIME: 202109162250
ISSUE DATE / TIME: 202109162250
Unit Type and Rh: 9500
Unit Type and Rh: 9500

## 2020-04-13 LAB — HEMOGLOBIN AND HEMATOCRIT, BLOOD
HCT: 23.6 % — ABNORMAL LOW (ref 36.0–46.0)
Hemoglobin: 8.2 g/dL — ABNORMAL LOW (ref 12.0–15.0)

## 2020-04-13 LAB — SARS CORONAVIRUS 2 BY RT PCR (HOSPITAL ORDER, PERFORMED IN ~~LOC~~ HOSPITAL LAB): SARS Coronavirus 2: NEGATIVE

## 2020-04-13 LAB — ABO/RH: ABO/RH(D): O POS

## 2020-04-13 MED ORDER — ACETAMINOPHEN 650 MG RE SUPP: 650.0000 mg | Freq: Four times a day (QID) | RECTAL | Status: DC | PRN

## 2020-04-13 MED ORDER — METRONIDAZOLE IN NACL 5-0.79 MG/ML-% IV SOLN
500.0000 mg | Freq: Once | INTRAVENOUS | Status: AC
  Administered 2020-04-13: 500 mg via INTRAVENOUS
  Filled 2020-04-13: qty 100

## 2020-04-13 MED ORDER — SODIUM CHLORIDE 0.9 % IV SOLN
1.0000 g | Freq: Once | INTRAVENOUS | Status: AC
  Administered 2020-04-13: 1 g via INTRAVENOUS
  Filled 2020-04-13: qty 10

## 2020-04-13 MED ORDER — ACETAMINOPHEN 325 MG PO TABS
650.0000 mg | ORAL_TABLET | Freq: Four times a day (QID) | ORAL | Status: DC | PRN
  Administered 2020-04-13: 650 mg via ORAL
  Filled 2020-04-13: qty 2

## 2020-04-13 MED ORDER — MORPHINE SULFATE (PF) 2 MG/ML IV SOLN
2.0000 mg | INTRAVENOUS | Status: DC | PRN
  Administered 2020-04-13 (×5): 2 mg via INTRAVENOUS
  Filled 2020-04-13 (×6): qty 1

## 2020-04-13 MED ORDER — MORPHINE SULFATE (PF) 4 MG/ML IV SOLN
4.0000 mg | Freq: Once | INTRAVENOUS | Status: AC
  Administered 2020-04-13: 4 mg via INTRAVENOUS
  Filled 2020-04-13: qty 1

## 2020-04-13 MED ORDER — BUTALBITAL-APAP-CAFFEINE 50-325-40 MG PO TABS
2.0000 | ORAL_TABLET | Freq: Four times a day (QID) | ORAL | Status: DC | PRN
  Administered 2020-04-14: 2 via ORAL
  Filled 2020-04-13: qty 2

## 2020-04-13 MED ORDER — ONDANSETRON HCL 4 MG PO TABS: 4.0000 mg | ORAL_TABLET | Freq: Four times a day (QID) | ORAL | Status: DC | PRN

## 2020-04-13 MED ORDER — SODIUM CHLORIDE 0.9 % IV SOLN
1.0000 g | INTRAVENOUS | Status: DC
  Administered 2020-04-13 – 2020-04-14 (×2): 1 g via INTRAVENOUS
  Filled 2020-04-13 (×3): qty 10

## 2020-04-13 MED ORDER — SODIUM CHLORIDE 0.9 % IV SOLN: INTRAVENOUS | Status: DC

## 2020-04-13 MED ORDER — METRONIDAZOLE IN NACL 5-0.79 MG/ML-% IV SOLN
500.0000 mg | Freq: Three times a day (TID) | INTRAVENOUS | Status: DC
  Administered 2020-04-13 – 2020-04-15 (×7): 500 mg via INTRAVENOUS
  Filled 2020-04-13 (×9): qty 100

## 2020-04-13 MED ORDER — ONDANSETRON HCL 4 MG/2ML IJ SOLN: 4.0000 mg | Freq: Four times a day (QID) | INTRAMUSCULAR | Status: DC | PRN

## 2020-04-13 MED ORDER — FENTANYL CITRATE (PF) 100 MCG/2ML IJ SOLN
50.0000 ug | INTRAMUSCULAR | Status: DC | PRN
  Administered 2020-04-13: 50 ug via INTRAVENOUS

## 2020-04-13 NOTE — ED Notes (Signed)
Pt returned to treatment room from CT. RN asked about visitation again and pt verbalized her daughter could come back to treatment room.

## 2020-04-13 NOTE — ED Notes (Signed)
Pt resting in bed with daughter resting in chair beside her. Pt in NAD at this time.

## 2020-04-13 NOTE — ED Notes (Signed)
Pt continues resting. NAD at this time.

## 2020-04-13 NOTE — ED Notes (Signed)
Patient assisted to bathroom 

## 2020-04-13 NOTE — ED Notes (Signed)
Pt continues to request more pain medication and asking for water to drink. MD made aware.

## 2020-04-13 NOTE — ED Notes (Signed)
Pt had taken off BP cuff and pulse ox while talking on personal phone. Reapplied. IV pump alarming since pt hand arm bent. Fixed.

## 2020-04-13 NOTE — ED Notes (Signed)
Pt able to ambulate to the bathroom without assistance. No dizziness reported. Pt cleaned and bed sheets changed. New ice chips provided and more pain medication administered per pt request.

## 2020-04-13 NOTE — ED Notes (Addendum)
MD Girguis at bedside. Ok to give morphine per MD Girguis. Blood pressure 89/70. Patient reported to doctor she takes about 2 BC powders a day for headache

## 2020-04-13 NOTE — H&P (Signed)
History and Physical    Claudia Cantu BTD:[id number] DOB: 04-Jun-1971 DOA: 04/12/2020  PCP: Patient, No Pcp Per   Patient coming from: Home  I have personally briefly reviewed patient's old medical records in [facility]  Chief Complaint: Coffee-ground emesis black and bloody stool  HPI: Claudia Cantu is a 49 y.o. female with medical history significant for hospitalization in 2015 for bleeding peptic ulcer, hepatitis C presenting to the emergency room after having multiple episodes of bright red blood and coffee-ground emesis at home as well as bright red blood per rectum with EMS reported melanotic stool at the scene.  Patient denies abdominal pain, fever or chills. ED Course: On arrival, tachycardic at 154, tachypneic at 25, BP 120/78.  Blood work significant for hemoglobin of 9.4, with no recent hemoglobin on file but was 14 3 years prior.  Leukocytosis of 15,000.  The emergency room provider spoke with GI on-call, Dr. Allegra Lai who recommended CT angio abdomen and pelvis.  Findings suggest no active gastrointestinal bleeding however did show diffuse colonic wall thickening compatible with inflammatory or infectious colitis.  Patient was pen allergic was started on Rocephin and Flagyl.  Also started on a Protonix infusion, typed and crossed.  Hospitalist consulted for admission  Review of Systems: As per HPI otherwise all other systems on review of systems negative.    Past Medical History:  Diagnosis Date  . Anemia    History of anemia and menorrhagia, menorrhagia treated with NovaSure  . Hepatitis C     Past Surgical History:  Procedure Laterality Date  . CESAREAN SECTION    . NOVASURE ABLATION    . TUBAL LIGATION       reports that she has been smoking cigarettes. She has been smoking about 1.00 pack per day. She has never used smokeless tobacco. She reports that she does not drink alcohol and does not use drugs.  Allergies  Allergen Reactions  . Penicillins Nausea And  Vomiting  . Codeine Rash    Family history reviewed: Not pertinent   Prior to Admission medications   Not on File    Physical Exam: Vitals:   04/12/20 2250 04/12/20 2300 04/12/20 2326 04/13/20 0015  BP:  (!) 100/53 (!) 100/53 120/70  Pulse:  (!) 117 (!) 104 96  Resp:  (!) 21 (!) 22 17  Temp: 100 F (37.8 C)  99.5 F (37.5 C)   TempSrc: Rectal  Axillary   SpO2:  100% 100% 98%     Vitals:   04/12/20 2250 04/12/20 2300 04/12/20 2326 04/13/20 0015  BP:  (!) 100/53 (!) 100/53 120/70  Pulse:  (!) 117 (!) 104 96  Resp:  (!) 21 (!) 22 17  Temp: 100 F (37.8 C)  99.5 F (37.5 C)   TempSrc: Rectal  Axillary   SpO2:  100% 100% 98%      Constitutional:  Ill-appearing and appears to be in pain discomfort  HEENT:      Head: Normocephalic and atraumatic.         Eyes: PERLA, EOMI, Conjunctivae are normal. Sclera is non-icteric.       Mouth/Throat: Mucous membranes are moist.       Neck: Supple with no signs of meningismus. Cardiovascular:  Tachycardic no murmurs, gallops, or rubs. 2+ symmetrical distal pulses are present . No JVD. No LE edema Respiratory: Respiratory effort slightly increased.Lungs sounds clear bilaterally. No wheezes, crackles, or rhonchi.  Gastrointestinal: Soft, lower abdominal tenderness diffuse, and non distended with  positive bowel sounds. No rebound or guarding. Genitourinary: No CVA tenderness. Musculoskeletal: Nontender with normal range of motion in all extremities. No cyanosis, or erythema of extremities. Neurologic:  Face is symmetric. Moving all extremities. No gross focal neurologic deficits . Skin: Skin is warm, dry.  No rash or ulcers Psychiatric: Mood and affect are normal Speech and behavior are normal   Labs on Admission: I have personally reviewed following labs and imaging studies  CBC: Recent Labs  Lab 04/12/20 2245  WBC 15.8*  NEUTROABS 13.3*  HGB 9.4*  HCT 27.6*  MCV 87.9  PLT 344   Basic Metabolic Panel: Recent Labs  Lab  04/12/20 2245  NA 140  K 3.4*  CL 107  CO2 20*  GLUCOSE 238*  BUN 33*  CREATININE 0.88  CALCIUM 8.3*   GFR: CrCl cannot be calculated (Unknown ideal weight.). Liver Function Tests: Recent Labs  Lab 04/12/20 2245  AST 22  ALT 11  ALKPHOS 48  BILITOT 0.4  PROT 6.1*  ALBUMIN 3.5   Recent Labs  Lab 04/12/20 2245  LIPASE 22   No results for input(s): AMMONIA in the last 168 hours. Coagulation Profile: Recent Labs  Lab 04/12/20 2245  INR 1.2   Cardiac Enzymes: No results for input(s): CKTOTAL, CKMB, CKMBINDEX, TROPONINI in the last 168 hours. BNP (last 3 results) No results for input(s): PROBNP in the last 8760 hours. HbA1C: No results for input(s): HGBA1C in the last 72 hours. CBG: No results for input(s): GLUCAP in the last 168 hours. Lipid Profile: No results for input(s): CHOL, HDL, LDLCALC, TRIG, CHOLHDL, LDLDIRECT in the last 72 hours. Thyroid Function Tests: No results for input(s): TSH, T4TOTAL, FREET4, T3FREE, THYROIDAB in the last 72 hours. Anemia Panel: No results for input(s): VITAMINB12, FOLATE, FERRITIN, TIBC, IRON, RETICCTPCT in the last 72 hours. Urine analysis:    Component Value Date/Time   COLORURINE Amber 02/17/2014 1524   APPEARANCEUR Clear 02/17/2014 1524   LABSPEC 1.016 02/17/2014 1524   PHURINE 8.0 02/17/2014 1524   GLUCOSEU Negative 02/17/2014 1524   HGBUR Negative 02/17/2014 1524   BILIRUBINUR Negative 02/17/2014 1524   KETONESUR Trace 02/17/2014 1524   PROTEINUR Negative 02/17/2014 1524   NITRITE Negative 02/17/2014 1524   LEUKOCYTESUR Negative 02/17/2014 1524    Radiological Exams on Admission: CT Angio Abd/Pel w/ and/or w/o  Result Date: 04/13/2020 CLINICAL DATA:  Vomiting, hematemesis and bright red blood per rectum EXAM: CTA ABDOMEN AND PELVIS WITHOUT AND WITH CONTRAST TECHNIQUE: Multidetector CT imaging of the abdomen and pelvis was performed using the standard protocol during bolus administration of intravenous contrast.  Multiplanar reconstructed images and MIPs were obtained and reviewed to evaluate the vascular anatomy. CONTRAST:  OMNIPAQUE IOHEXOL 350 MG/ML SOLN COMPARISON:  04/01/2017 FINDINGS: VASCULAR Aorta: Normal caliber aorta without aneurysm, dissection, vasculitis or significant stenosis. Celiac: Patent without evidence of aneurysm, dissection, vasculitis or significant stenosis. SMA: Patent without evidence of aneurysm, dissection, vasculitis or significant stenosis. Renals: Both renal arteries are patent without evidence of aneurysm, dissection, vasculitis, fibromuscular dysplasia or significant stenosis. IMA: Patent without evidence of aneurysm, dissection, vasculitis or significant stenosis. Inflow: Patent without evidence of aneurysm, dissection, vasculitis or significant stenosis. Proximal Outflow: Bilateral common femoral and visualized portions of the superficial and profunda femoral arteries are patent without evidence of aneurysm, dissection, vasculitis or significant stenosis. Veins: No obvious venous abnormality within the limitations of this arterial phase study. Review of the MIP images confirms the above findings. NON-VASCULAR Lower chest: No acute pleural or  parenchymal lung disease. Hepatobiliary: No focal liver abnormality is seen. No gallstones, gallbladder wall thickening, or biliary dilatation. Pancreas: Unremarkable. No pancreatic ductal dilatation or surrounding inflammatory changes. Spleen: Normal in size without focal abnormality. Adrenals/Urinary Tract: Adrenal glands are unremarkable. Kidneys are normal, without renal calculi, focal lesion, or hydronephrosis. Bladder is unremarkable. Stomach/Bowel: No bowel obstruction or ileus. There is diffuse colonic wall thickening, consistent with inflammatory or infectious colitis. The appendix is normal. There is no intraluminal accumulation of contrast to suggest active gastrointestinal bleeding. Lymphatic: No pathologic adenopathy. Reproductive:  Uterus and bilateral adnexa are unremarkable. Other: No free fluid or free gas.  No abdominal wall hernia. Musculoskeletal: No acute or destructive bony lesions. Reconstructed images demonstrate no additional findings. IMPRESSION: VASCULAR 1. No evidence of active gastrointestinal bleeding. NON-VASCULAR 1. Diffuse colonic wall thickening compatible with inflammatory or infectious colitis. Electronically Signed   By: Sharlet Salina M.D.   On: 04/13/2020 00:19    EKG: Independently reviewed. Interpretation : Sinus tachycardia  Assessment/Plan 49 year old female with history of hospitalization in 2015 for bleeding peptic ulcer, hepatitis C presenting to the emergency room after having multiple episodes of bright red blood and coffee-ground emesis at home as well as bright red blood per rectum .    Upper GI bleed   History of bleeding peptic ulcer -Patient with hematemesis, melanotic stool on hematochezia -CTA abdomen and pelvis with no evidence of active bleeding -Hemoglobin 9.4 with no recent hemoglobin on file but was around 14 3 years prior, -Serial hemoglobins and transfuse depending on rate of drop in hemoglobin -Continue Protonix infusion -Dr. Allegra Lai aware of consult.  Please consult to GI in a.m.    DVT prophylaxis: SCDs Code Status: full code  Family Communication:  none  Disposition Plan: Back to previous home environment Consults called: none  Status:At the time of admission, it appears that the appropriate admission status for this patient is INPATIENT. This is judged to be reasonable and necessary in order to provide the required intensity of service to ensure the patient's safety given the presenting symptoms, physical exam findings, and initial radiographic and laboratory data in the context of their  Comorbid conditions.   Patient requires inpatient status due to high intensity of service, high risk for further deterioration and high frequency of surveillance required.   I certify  that at the point of admission it is my clinical judgment that the patient will require inpatient hospital care spanning beyond 2 midnights     Andris Baumann MD [facility]     04/13/2020, 1:18 AM

## 2020-04-13 NOTE — Consult Note (Signed)
Wyline Mood , MD [address], [address], [place], [place], [address] [address], [address], [place], [place], [address] Phone: [phone]  Fax: [phone]  Consultation  Referring Provider:     Dr Frederick Peers Primary Care Physician:  Patient, No Pcp Per Primary Gastroenterologist:  None          Reason for Consultation:     GI bleed   Date of Admission:  04/12/2020 Date of Consultation:  04/13/2020         HPI:   Claudia Cantu is a 49 y.o. female scented to the emergency room with coffee-ground emesis and bloody stools.  She has history of hepatitis C.   On-call doctor was discussed with last night and the patient underwent a CT angiogram of the abdomen pelvis with and without contrast that demonstrated no evidence of active GI bleeding but showed diffuse colonic wall thickening compatible with inflammatory or infectious colitis. On admission hemoglobin was 9.4 g with an MCV of 87.9 and an INR of 1.2.  Elevated BUN 33 creatinine 0.88 LFTs normal, albumin normal.  This morning hemoglobin is 10.8 g.  She states that she has been taking BC powders for a long time on a regular basis and yesterday morning all of a sudden had hematemesis for the few occasions.  Some midline abdominal pain which has been resolving.  She has had multiple black bowel movements last one was this morning at 9 AM.  She wants to eat and drink.  Denies any excess alcohol consumption   Past Medical History:  Diagnosis Date  . Anemia    History of anemia and menorrhagia, menorrhagia treated with NovaSure  . Hepatitis C     Past Surgical History:  Procedure Laterality Date  . CESAREAN SECTION    . NOVASURE ABLATION    . TUBAL LIGATION      Prior to Admission medications   Not on File    No family history on file.   Social History   Tobacco Use  . Smoking status: Current Every Day Smoker    Packs/day: 1.00    Types: Cigarettes  . Smokeless tobacco: Never Used  Substance Use Topics  . Alcohol  use: No  . Drug use: No    Allergies as of 04/12/2020 - Review Complete 04/12/2020  Allergen Reaction Noted  . Penicillins Nausea And Vomiting 04/02/2015  . Codeine Rash 04/02/2015    Review of Systems:    All systems reviewed and negative except where noted in HPI.   Physical Exam:  Vital signs in last 24 hours: Temp:  [98.7 F (37.1 C)-100 F (37.8 C)] 98.7 F (37.1 C) (09/17 0911) Pulse Rate:  [88-154] 95 (09/17 1242) Resp:  [12-25] 16 (09/17 1242) BP: (89-120)/(53-79) 110/79 (09/17 1242) SpO2:  [93 %-100 %] 98 % (09/17 1242)   General:   Pleasant, cooperative in NAD Head:  Normocephalic and atraumatic. Eyes:   No icterus.   Conjunctiva pink. PERRLA. Lungs: Respirations even and unlabored. Lungs clear to auscultation bilaterally.   No wheezes, crackles, or rhonchi.  Heart:  Regular rate and rhythm;  Without murmur, clicks, rubs or gallops Abdomen:  Soft, nondistended, nontender. Normal bowel sounds. No appreciable masses or hepatomegaly.  No rebound or guarding.  Neurologic:  Alert and oriented x3;  grossly normal neurologically. Psych:  Alert and cooperative. Normal affect.  LAB RESULTS: Recent Labs    04/12/20 2245 04/13/20 0547  WBC 15.8* 12.5*  HGB 9.4* 10.8*  HCT 27.6* 31.0*  PLT 344 138*   BMET Recent Labs    04/12/20 2245 04/13/20 0547  NA 140 138  K 3.4* 3.8  CL 107 106  CO2 20* 24  GLUCOSE 238* 123*  BUN 33* 25*  CREATININE 0.88 0.66  CALCIUM 8.3* 7.8*   LFT Recent Labs    04/12/20 2245  PROT 6.1*  ALBUMIN 3.5  AST 22  ALT 11  ALKPHOS 48  BILITOT 0.4   PT/INR Recent Labs    04/12/20 2245  LABPROT 15.1  INR 1.2    STUDIES: CT Angio Abd/Pel w/ and/or w/o  Result Date: 04/13/2020 CLINICAL DATA:  Vomiting, hematemesis and bright red blood per rectum EXAM: CTA ABDOMEN AND PELVIS WITHOUT AND WITH CONTRAST TECHNIQUE: Multidetector CT imaging of the abdomen and pelvis was performed using the standard protocol during bolus  administration of intravenous contrast. Multiplanar reconstructed images and MIPs were obtained and reviewed to evaluate the vascular anatomy. CONTRAST:  OMNIPAQUE IOHEXOL 350 MG/ML SOLN COMPARISON:  04/01/2017 FINDINGS: VASCULAR Aorta: Normal caliber aorta without aneurysm, dissection, vasculitis or significant stenosis. Celiac: Patent without evidence of aneurysm, dissection, vasculitis or significant stenosis. SMA: Patent without evidence of aneurysm, dissection, vasculitis or significant stenosis. Renals: Both renal arteries are patent without evidence of aneurysm, dissection, vasculitis, fibromuscular dysplasia or significant stenosis. IMA: Patent without evidence of aneurysm, dissection, vasculitis or significant stenosis. Inflow: Patent without evidence of aneurysm, dissection, vasculitis or significant stenosis. Proximal Outflow: Bilateral common femoral and visualized portions of the superficial and profunda femoral arteries are patent without evidence of aneurysm, dissection, vasculitis or significant stenosis. Veins: No obvious venous abnormality within the limitations of this arterial phase study. Review of the MIP images confirms the above findings. NON-VASCULAR Lower chest: No acute pleural or parenchymal lung disease. Hepatobiliary: No focal liver abnormality is seen. No gallstones, gallbladder wall thickening, or biliary dilatation. Pancreas: Unremarkable. No pancreatic ductal dilatation or surrounding inflammatory changes. Spleen: Normal in size without focal abnormality. Adrenals/Urinary Tract: Adrenal glands are unremarkable. Kidneys are normal, without renal calculi, focal lesion, or hydronephrosis. Bladder is unremarkable. Stomach/Bowel: No bowel obstruction or ileus. There is diffuse colonic wall thickening, consistent with inflammatory or infectious colitis. The appendix is normal. There is no intraluminal accumulation of contrast to suggest active gastrointestinal bleeding. Lymphatic:  No pathologic adenopathy. Reproductive: Uterus and bilateral adnexa are unremarkable. Other: No free fluid or free gas.  No abdominal wall hernia. Musculoskeletal: No acute or destructive bony lesions. Reconstructed images demonstrate no additional findings. IMPRESSION: VASCULAR 1. No evidence of active gastrointestinal bleeding. NON-VASCULAR 1. Diffuse colonic wall thickening compatible with inflammatory or infectious colitis. Electronically Signed   By: Sharlet Salina M.D.   On: 04/13/2020 00:19      Impression / Plan:   Claudia Cantu is a 49 y.o. y/o female presented to the hospital with hematemesis and melena.  CT angiogram showed no acute bleed but showed colitis.  Last episode of hematemesis was yesterday and episode of melena was at 9 AM this morning over 5 hours back.  Very likely secondary to long-term NSAID use.  The colitis could also be related to NSAIDs.  Plan 1.  Monitor CBC and transfuse as needed 2.  Avoid all NSAIDs 3.  IV PPI gtt. if repeat hemoglobin is stable in a few hours can have some clears to mid night 4.  If having diarrhea check stool for C. difficile and GI PCR. 5.  EGD tomorrow   I have discussed alternative options, risks & benefits,  which include, but are not limited to, bleeding, infection, perforation,respiratory complication & drug reaction.  The patient agrees with this plan & written consent will be obtained.    Thank you for involving me in the care of this patient.      LOS: 0 days   Wyline Mood, MD  04/13/2020, 1:51 PM

## 2020-04-13 NOTE — Hospital Course (Addendum)
Claudia Cantu is a 49 yo CF with PMH chronic headaches with ongoing BC powder use, PUD, hepatitis C who presented to the ER with abdominal pain, hematemesis, and bright red blood per rectum.  She was brought to the hospital by EMS who had also noted some melanotic stools. Initial hemoglobin was 9.4, she was prepared for possible blood transfusion due to the amount of described blood loss however hemoglobin remained relatively stable with no further significant drop and she also remained hemodynamically stable. She was started on a Protonix drip and IV fluids.  Given her ongoing use of BC powders at home, this was considered likely contributing to an underlying recurrent ulcer.  GI was consulted and tentative plans are for EGD on 04/14/2020. She was found to have a clean-based gastric ulcer.  She had severe inflammation in the gastric antrum.  She was again emphasized the importance to remain off of BC powders and other NSAIDs at discharge.  She was given a prescription for Protonix 40 mg twice daily at discharge. She did endorse difficulty with obtaining medications and does not have a routine doctor.  She was given [facility] and states that she will follow up. Her hemoglobin remained stable with further trending and monitoring.  She had no further hematemesis nor rectal bleeding and was considered stable for discharging home.

## 2020-04-13 NOTE — Progress Notes (Signed)
PROGRESS NOTE    Claudia Cantu   NWG:[id number]  DOB: [birth year]  DOA: 04/12/2020     0  PCP: Patient, No Pcp Per  CC: hematemesis, BRBPR  Hospital Course: Claudia Cantu is a 49 yo CF with PMH chronic headaches with ongoing BC powder use, PUD, hepatitis C who presented to the ER with abdominal pain, hematemesis, and bright red blood per rectum.  She was brought to the hospital by EMS who had also noted some melanotic stools. Initial hemoglobin was 9.4, she was prepared for possible blood transfusion due to the amount of described blood loss however hemoglobin remained relatively stable with no further significant drop and she also remained hemodynamically stable. She was started on a Protonix drip and IV fluids.  Given her ongoing use of BC powders at home, this was considered likely contributing to an underlying recurrent ulcer.  GI was consulted and tentative plans are for EGD on 04/14/2020.   Interval History:  Patient seen this morning in the ER.  Having ongoing abdominal pain and was being given morphine which seems to help.  No further vomiting or bloody bowel movements appreciated since admission.  Understands plan is for tentative EGD tomorrow.  Old records reviewed in assessment of this patient  ROS: Constitutional: negative for chills and fevers, Respiratory: negative for cough, Cardiovascular: negative for chest pain and Gastrointestinal: positive for abdominal pain  Assessment & Plan: Upper GI bleed -Suspected recurrent peptic ulcer given ongoing chronic use of BC powders at home.  She states using at least 2/day.  She has ongoing chronic headaches that she is treating -CT abdomen/pelvis performed on admission which was negative for active bleeding -Continue trending hemoglobin, remaining stable so far - continue IVF -GI consulted, appreciate assistance.  Tentative EGD in a.m. -N.p.o. at midnight   Antimicrobials: Rocephin 04/13/2020>> present  DVT prophylaxis:  SCD Code Status: Full Family Communication: None present Disposition Plan: Status is: Inpatient  Remains inpatient appropriate because:Ongoing diagnostic testing needed not appropriate for outpatient work up, Unsafe d/c plan, IV treatments appropriate due to intensity of illness or inability to take PO and Inpatient level of care appropriate due to severity of illness   Dispo: The patient is from: Home              Anticipated d/c is to: Home              Anticipated d/c date is: 2 days              Patient currently is not medically stable to d/c.       Objective: Blood pressure 102/64, pulse 71, temperature 98.7 F (37.1 C), temperature source Oral, resp. rate 14, SpO2 98 %.  Examination: General appearance: Adult woman lying in bed appearing uncomfortable but in no obvious distress Head: Normocephalic, without obvious abnormality, atraumatic Eyes: EOMI Lungs: clear to auscultation bilaterally Heart: regular rate and rhythm and S1, S2 normal Abdomen: Soft, nondistended.  Tender throughout but no rebound or guarding.  Bowel sounds present Extremities: No edema Skin: mobility and turgor normal Neurologic: Grossly normal  Consultants:   GI  Procedures:   None  Data Reviewed: I have personally reviewed following labs and imaging studies Results for orders placed or performed during the hospital encounter of 04/12/20 (from the past 24 hour(s))  Prepare RBC (crossmatch)     Status: None   Collection Time: 04/12/20 10:42 PM  Result Value Ref Range   Order Confirmation      ORDER  PROCESSED BY BLOOD BANK Performed at [facility], [address] [place], [place] [address]   Comprehensive metabolic panel     Status: Abnormal   Collection Time: 04/12/20 10:45 PM  Result Value Ref Range   Sodium 140 135 - 145 mmol/L   Potassium 3.4 (L) 3.5 - 5.1 mmol/L   Chloride 107 98 - 111 mmol/L   CO2 20 (L) 22 - 32 mmol/L   Glucose, Bld 238 (H) 70 - 99 mg/dL   BUN 33  (H) 6 - 20 mg/dL   Creatinine, Ser 2.42 0.44 - 1.00 mg/dL   Calcium 8.3 (L) 8.9 - 10.3 mg/dL   Total Protein 6.1 (L) 6.5 - 8.1 g/dL   Albumin 3.5 3.5 - 5.0 g/dL   AST 22 15 - 41 U/L   ALT 11 0 - 44 U/L   Alkaline Phosphatase 48 38 - 126 U/L   Total Bilirubin 0.4 0.3 - 1.2 mg/dL   GFR calc non Af Amer >60 >60 mL/min   GFR calc Af Amer >60 >60 mL/min   Anion gap 13 5 - 15  Ethanol     Status: None   Collection Time: 04/12/20 10:45 PM  Result Value Ref Range   Alcohol, Ethyl (B) <10 <10 mg/dL  Lipase, blood     Status: None   Collection Time: 04/12/20 10:45 PM  Result Value Ref Range   Lipase 22 11 - 51 U/L  CBC with Differential     Status: Abnormal   Collection Time: 04/12/20 10:45 PM  Result Value Ref Range   WBC 15.8 (H) 4.0 - 10.5 K/uL   RBC 3.14 (L) 3.87 - 5.11 MIL/uL   Hemoglobin 9.4 (L) 12.0 - 15.0 g/dL   HCT 68.3 (L) 36 - 46 %   MCV 87.9 80.0 - 100.0 fL   MCH 29.9 26.0 - 34.0 pg   MCHC 34.1 30.0 - 36.0 g/dL   RDW 41.9 62.2 - 29.7 %   Platelets 344 150 - 400 K/uL   nRBC 0.0 0.0 - 0.2 %   Neutrophils Relative % 84 %   Neutro Abs 13.3 (H) 1.7 - 7.7 K/uL   Lymphocytes Relative 12 %   Lymphs Abs 1.9 0.7 - 4.0 K/uL   Monocytes Relative 3 %   Monocytes Absolute 0.5 0 - 1 K/uL   Eosinophils Relative 0 %   Eosinophils Absolute 0.0 0 - 0 K/uL   Basophils Relative 0 %   Basophils Absolute 0.0 0 - 0 K/uL   Immature Granulocytes 1 %   Abs Immature Granulocytes 0.10 (H) 0.00 - 0.07 K/uL  Protime-INR     Status: None   Collection Time: 04/12/20 10:45 PM  Result Value Ref Range   Prothrombin Time 15.1 11.4 - 15.2 seconds   INR 1.2 0.8 - 1.2  Type and screen [facility]     Status: None   Collection Time: 04/12/20 10:45 PM  Result Value Ref Range   ABO/RH(D) O POS    Antibody Screen NEG    Sample Expiration      04/15/2020,2359 Performed at [facility], [address], [place], [place] [address]    Unit Number [id number]    Blood  Component Type RED CELLS,LR    Unit division 00    Status of Unit ISSUED,FINAL    Transfusion Status OK TO TRANSFUSE    Crossmatch Result COMPATIBLE    Unit Number [id number]    Blood Component Type RED CELLS,LR  Unit division 00    Status of Unit ISSUED,FINAL    Transfusion Status OK TO TRANSFUSE    Crossmatch Result COMPATIBLE   SARS Coronavirus 2 by RT PCR (hospital order, performed in [facility] hospital lab) Nasopharyngeal Nasopharyngeal Swab     Status: None   Collection Time: 04/12/20 10:56 PM   Specimen: Nasopharyngeal Swab  Result Value Ref Range   SARS Coronavirus 2 NEGATIVE NEGATIVE  ABO/Rh     Status: None   Collection Time: 04/13/20 12:46 AM  Result Value Ref Range   ABO/RH(D)      O POS Performed at [facility], [address] [place], [place] [address]   CBC     Status: Abnormal   Collection Time: 04/13/20  5:47 AM  Result Value Ref Range   WBC 12.5 (H) 4.0 - 10.5 K/uL   RBC 3.54 (L) 3.87 - 5.11 MIL/uL   Hemoglobin 10.8 (L) 12.0 - 15.0 g/dL   HCT 52.8 (L) 36 - 46 %   MCV 87.6 80.0 - 100.0 fL   MCH 30.5 26.0 - 34.0 pg   MCHC 34.8 30.0 - 36.0 g/dL   RDW 41.3 24.4 - 01.0 %   Platelets 138 (L) 150 - 400 K/uL   nRBC 0.0 0.0 - 0.2 %  Basic metabolic panel     Status: Abnormal   Collection Time: 04/13/20  5:47 AM  Result Value Ref Range   Sodium 138 135 - 145 mmol/L   Potassium 3.8 3.5 - 5.1 mmol/L   Chloride 106 98 - 111 mmol/L   CO2 24 22 - 32 mmol/L   Glucose, Bld 123 (H) 70 - 99 mg/dL   BUN 25 (H) 6 - 20 mg/dL   Creatinine, Ser 2.72 0.44 - 1.00 mg/dL   Calcium 7.8 (L) 8.9 - 10.3 mg/dL   GFR calc non Af Amer >60 >60 mL/min   GFR calc Af Amer >60 >60 mL/min   Anion gap 8 5 - 15  Urinalysis, Complete w Microscopic Urine, Clean Catch     Status: Abnormal   Collection Time: 04/13/20  9:14 AM  Result Value Ref Range   Color, Urine YELLOW (A) YELLOW   APPearance CLEAR (A) CLEAR   Specific Gravity, Urine 1.032 (H) 1.005 - 1.030   pH 5.0  5.0 - 8.0   Glucose, UA NEGATIVE NEGATIVE mg/dL   Hgb urine dipstick MODERATE (A) NEGATIVE   Bilirubin Urine NEGATIVE NEGATIVE   Ketones, ur NEGATIVE NEGATIVE mg/dL   Protein, ur NEGATIVE NEGATIVE mg/dL   Nitrite NEGATIVE NEGATIVE   Leukocytes,Ua NEGATIVE NEGATIVE   WBC, UA 0-5 0 - 5 WBC/hpf   Bacteria, UA NONE SEEN NONE SEEN   Squamous Epithelial / LPF 0-5 0 - 5   Mucus PRESENT     Recent Results (from the past 240 hour(s))  SARS Coronavirus 2 by RT PCR (hospital order, performed in [facility] hospital lab) Nasopharyngeal Nasopharyngeal Swab     Status: None   Collection Time: 04/12/20 10:56 PM   Specimen: Nasopharyngeal Swab  Result Value Ref Range Status   SARS Coronavirus 2 NEGATIVE NEGATIVE Final    Comment: (NOTE) SARS-CoV-2 target nucleic acids are NOT DETECTED.  The SARS-CoV-2 RNA is generally detectable in upper and lower respiratory specimens during the acute phase of infection. The lowest concentration of SARS-CoV-2 viral copies this assay can detect is 250 copies / mL. A negative result does not preclude SARS-CoV-2 infection and should not be used as the sole basis  for treatment or other patient management decisions.  A negative result may occur with improper specimen collection / handling, submission of specimen other than nasopharyngeal swab, presence of viral mutation(s) within the areas targeted by this assay, and inadequate number of viral copies (<250 copies / mL). A negative result must be combined with clinical observations, patient history, and epidemiological information.  Fact Sheet for Patients:   BoilerBrush.com.cy  Fact Sheet for Healthcare Providers: https://pope.com/  This test is not yet approved or  cleared by the [place] FDA and has been authorized for detection and/or diagnosis of SARS-CoV-2 by FDA under an Emergency Use Authorization (EUA).  This EUA will remain in effect (meaning this  test can be used) for the duration of the COVID-19 declaration under Section 564(b)(1) of the Act, 21 U.S.C. section 360bbb-3(b)(1), unless the authorization is terminated or revoked sooner.  Performed at [facility], [address] [place], [place] [address]      Radiology Studies: CT Angio Abd/Pel w/ and/or w/o  Result Date: 04/13/2020 CLINICAL DATA:  Vomiting, hematemesis and bright red blood per rectum EXAM: CTA ABDOMEN AND PELVIS WITHOUT AND WITH CONTRAST TECHNIQUE: Multidetector CT imaging of the abdomen and pelvis was performed using the standard protocol during bolus administration of intravenous contrast. Multiplanar reconstructed images and MIPs were obtained and reviewed to evaluate the vascular anatomy. CONTRAST:  OMNIPAQUE IOHEXOL 350 MG/ML SOLN COMPARISON:  04/01/2017 FINDINGS: VASCULAR Aorta: Normal caliber aorta without aneurysm, dissection, vasculitis or significant stenosis. Celiac: Patent without evidence of aneurysm, dissection, vasculitis or significant stenosis. SMA: Patent without evidence of aneurysm, dissection, vasculitis or significant stenosis. Renals: Both renal arteries are patent without evidence of aneurysm, dissection, vasculitis, fibromuscular dysplasia or significant stenosis. IMA: Patent without evidence of aneurysm, dissection, vasculitis or significant stenosis. Inflow: Patent without evidence of aneurysm, dissection, vasculitis or significant stenosis. Proximal Outflow: Bilateral common femoral and visualized portions of the superficial and profunda femoral arteries are patent without evidence of aneurysm, dissection, vasculitis or significant stenosis. Veins: No obvious venous abnormality within the limitations of this arterial phase study. Review of the MIP images confirms the above findings. NON-VASCULAR Lower chest: No acute pleural or parenchymal lung disease. Hepatobiliary: No focal liver abnormality is seen. No gallstones, gallbladder wall  thickening, or biliary dilatation. Pancreas: Unremarkable. No pancreatic ductal dilatation or surrounding inflammatory changes. Spleen: Normal in size without focal abnormality. Adrenals/Urinary Tract: Adrenal glands are unremarkable. Kidneys are normal, without renal calculi, focal lesion, or hydronephrosis. Bladder is unremarkable. Stomach/Bowel: No bowel obstruction or ileus. There is diffuse colonic wall thickening, consistent with inflammatory or infectious colitis. The appendix is normal. There is no intraluminal accumulation of contrast to suggest active gastrointestinal bleeding. Lymphatic: No pathologic adenopathy. Reproductive: Uterus and bilateral adnexa are unremarkable. Other: No free fluid or free gas.  No abdominal wall hernia. Musculoskeletal: No acute or destructive bony lesions. Reconstructed images demonstrate no additional findings. IMPRESSION: VASCULAR 1. No evidence of active gastrointestinal bleeding. NON-VASCULAR 1. Diffuse colonic wall thickening compatible with inflammatory or infectious colitis. Electronically Signed   By: Sharlet Salina M.D.   On: 04/13/2020 00:19   CT Angio Abd/Pel w/ and/or w/o  Final Result      Scheduled Meds: PRN Meds: acetaminophen **OR** acetaminophen, morphine injection, ondansetron **OR** ondansetron (ZOFRAN) IV Continuous Infusions: . sodium chloride Stopped (04/13/20 0345)  . sodium chloride 100 mL/hr at 04/13/20 0853  . cefTRIAXone (ROCEPHIN)  IV    . metronidazole Stopped (04/13/20 1242)  . pantoprozole (PROTONIX) infusion 8  mg/hr (04/13/20 0855)      LOS: 0 days  Time spent: Greater than 50% of the 35 minute visit was spent in counseling/coordination of care for the patient as laid out in the A&P.   Lewie Chamberavid Baraa Tubbs, MD [facility] 04/13/2020, 5:18 PM  Contact via secure chat.  To contact the attending provider between 7A-7P or the covering provider during after hours 7P-7A, please log into the web site [email or web address] and access  using universal [place] password for that web site. If you do not have the password, please call the hospital operator.

## 2020-04-13 NOTE — Assessment & Plan Note (Addendum)
-  Suspected recurrent peptic ulcer given ongoing chronic use of BC powders at home.  She states using at least 2/day.  She has ongoing chronic headaches that she is treating -CT abdomen/pelvis performed on admission which was negative for active bleeding - Hgb has remained stable - s/p EGD on 9/18: severe inflammation with gastric antrum erosions; 1 non-bleeding cratered gastric ulcer (clean based) - d/c protonix gtt; start protonix PO BID - diet resumed - again discussed importance this am with patient regarding refraining from [facility] powders

## 2020-04-13 NOTE — ED Provider Notes (Addendum)
CT with no active bleeding and positive for colitis. Will cover with rocephin and flagyl IV. Patient HD stable on protonix. Dr. Allegra Lai consulted. Will admit to Hospitalist.    I have personally reviewed the images performed during this visit and I agree with the Radiologist's read.   Interpretation by Radiologist:  CT Angio Abd/Pel w/ and/or w/o  Result Date: 04/13/2020 CLINICAL DATA:  Vomiting, hematemesis and bright red blood per rectum EXAM: CTA ABDOMEN AND PELVIS WITHOUT AND WITH CONTRAST TECHNIQUE: Multidetector CT imaging of the abdomen and pelvis was performed using the standard protocol during bolus administration of intravenous contrast. Multiplanar reconstructed images and MIPs were obtained and reviewed to evaluate the vascular anatomy. CONTRAST:  OMNIPAQUE IOHEXOL 350 MG/ML SOLN COMPARISON:  04/01/2017 FINDINGS: VASCULAR Aorta: Normal caliber aorta without aneurysm, dissection, vasculitis or significant stenosis. Celiac: Patent without evidence of aneurysm, dissection, vasculitis or significant stenosis. SMA: Patent without evidence of aneurysm, dissection, vasculitis or significant stenosis. Renals: Both renal arteries are patent without evidence of aneurysm, dissection, vasculitis, fibromuscular dysplasia or significant stenosis. IMA: Patent without evidence of aneurysm, dissection, vasculitis or significant stenosis. Inflow: Patent without evidence of aneurysm, dissection, vasculitis or significant stenosis. Proximal Outflow: Bilateral common femoral and visualized portions of the superficial and profunda femoral arteries are patent without evidence of aneurysm, dissection, vasculitis or significant stenosis. Veins: No obvious venous abnormality within the limitations of this arterial phase study. Review of the MIP images confirms the above findings. NON-VASCULAR Lower chest: No acute pleural or parenchymal lung disease. Hepatobiliary: No focal liver abnormality is seen. No gallstones,  gallbladder wall thickening, or biliary dilatation. Pancreas: Unremarkable. No pancreatic ductal dilatation or surrounding inflammatory changes. Spleen: Normal in size without focal abnormality. Adrenals/Urinary Tract: Adrenal glands are unremarkable. Kidneys are normal, without renal calculi, focal lesion, or hydronephrosis. Bladder is unremarkable. Stomach/Bowel: No bowel obstruction or ileus. There is diffuse colonic wall thickening, consistent with inflammatory or infectious colitis. The appendix is normal. There is no intraluminal accumulation of contrast to suggest active gastrointestinal bleeding. Lymphatic: No pathologic adenopathy. Reproductive: Uterus and bilateral adnexa are unremarkable. Other: No free fluid or free gas.  No abdominal wall hernia. Musculoskeletal: No acute or destructive bony lesions. Reconstructed images demonstrate no additional findings. IMPRESSION: VASCULAR 1. No evidence of active gastrointestinal bleeding. NON-VASCULAR 1. Diffuse colonic wall thickening compatible with inflammatory or infectious colitis. Electronically Signed   By: Sharlet Salina M.D.   On: 04/13/2020 00:19      Nita Sickle, MD 04/13/20 Janeann Merl, [place], MD 04/13/20 [phone]

## 2020-04-13 NOTE — ED Notes (Addendum)
MD at bedside with daughter and patient. MD gave RN verbal consent that pt could have intermittent ice chips.

## 2020-04-14 ENCOUNTER — Inpatient Hospital Stay: Payer: Self-pay | Admitting: Anesthesiology

## 2020-04-14 ENCOUNTER — Other Ambulatory Visit: Payer: Self-pay

## 2020-04-14 ENCOUNTER — Encounter: Payer: Self-pay | Admitting: Internal Medicine

## 2020-04-14 ENCOUNTER — Encounter
Admission: EM | Disposition: A | Discharge status: Home / Self Care | Payer: Self-pay | Source: Home / Self Care | Attending: Internal Medicine

## 2020-04-14 DIAGNOSIS — K2901 Acute gastritis with bleeding: Secondary | ICD-10-CM

## 2020-04-14 DIAGNOSIS — K253 Acute gastric ulcer without hemorrhage or perforation: Secondary | ICD-10-CM

## 2020-04-14 DIAGNOSIS — K921 Melena: Secondary | ICD-10-CM

## 2020-04-14 DIAGNOSIS — F1320 Sedative, hypnotic or anxiolytic dependence, uncomplicated: Secondary | ICD-10-CM

## 2020-04-14 DIAGNOSIS — R519 Headache, unspecified: Secondary | ICD-10-CM

## 2020-04-14 DIAGNOSIS — G8929 Other chronic pain: Secondary | ICD-10-CM

## 2020-04-14 HISTORY — PX: ESOPHAGOGASTRODUODENOSCOPY (EGD) WITH PROPOFOL: SHX5813

## 2020-04-14 LAB — BASIC METABOLIC PANEL
Anion gap: 7 (ref 5–15)
BUN: 8 mg/dL (ref 6–20)
CO2: 23 mmol/L (ref 22–32)
Calcium: 8 mg/dL — ABNORMAL LOW (ref 8.9–10.3)
Chloride: 110 mmol/L (ref 98–111)
Creatinine, Ser: 0.59 mg/dL (ref 0.44–1.00)
GFR calc Af Amer: >60 mL/min (ref >60)
GFR calc non Af Amer: >60 mL/min (ref >60)
Glucose, Bld: 95 mg/dL (ref 70–99)
Potassium: 3.2 mmol/L — ABNORMAL LOW (ref 3.5–5.1)
Sodium: 140 mmol/L (ref 135–145)

## 2020-04-14 LAB — CBC WITH DIFFERENTIAL/PLATELET
Abs Immature Granulocytes: 0.02 10*3/uL (ref 0.00–0.07)
Basophils Absolute: 0 10*3/uL (ref 0.0–0.1)
Basophils Relative: 0 %
Eosinophils Absolute: 0 10*3/uL (ref 0.0–0.5)
Eosinophils Relative: 0 %
HCT: 24.1 % — ABNORMAL LOW (ref 36.0–46.0)
Hemoglobin: 8.7 g/dL — ABNORMAL LOW (ref 12.0–15.0)
Immature Granulocytes: 0 %
Lymphocytes Relative: 21 %
Lymphs Abs: 1.5 10*3/uL (ref 0.7–4.0)
MCH: 30.7 pg (ref 26.0–34.0)
MCHC: 36.1 g/dL — ABNORMAL HIGH (ref 30.0–36.0)
MCV: 85.2 fL (ref 80.0–100.0)
Monocytes Absolute: 0.4 10*3/uL (ref 0.1–1.0)
Monocytes Relative: 6 %
Neutro Abs: 5 10*3/uL (ref 1.7–7.7)
Neutrophils Relative %: 73 %
Platelets: 125 10*3/uL — ABNORMAL LOW (ref 150–400)
RBC: 2.83 MIL/uL — ABNORMAL LOW (ref 3.87–5.11)
RDW: 14.4 % (ref 11.5–15.5)
WBC: 7 10*3/uL (ref 4.0–10.5)
nRBC: 0 % (ref 0.0–0.2)

## 2020-04-14 LAB — MAGNESIUM: Magnesium: 1.6 mg/dL — ABNORMAL LOW (ref 1.7–2.4)

## 2020-04-14 SURGERY — ESOPHAGOGASTRODUODENOSCOPY (EGD) WITH PROPOFOL
Anesthesia: General

## 2020-04-14 MED ORDER — ALPRAZOLAM 0.5 MG PO TABS
0.5000 mg | ORAL_TABLET | Freq: Every evening | ORAL | Status: DC | PRN
  Administered 2020-04-14: 0.5 mg via ORAL
  Filled 2020-04-14: qty 1

## 2020-04-14 MED ORDER — SODIUM CHLORIDE 0.9% FLUSH: 10.0000 mL | INTRAVENOUS | Status: DC | PRN

## 2020-04-14 MED ORDER — PROPOFOL 10 MG/ML IV BOLUS
INTRAVENOUS | Status: DC | PRN
  Administered 2020-04-14: 80 mg via INTRAVENOUS
  Administered 2020-04-14: 30 mg via INTRAVENOUS
  Administered 2020-04-14: 20 mg via INTRAVENOUS
  Administered 2020-04-14: 30 mg via INTRAVENOUS

## 2020-04-14 MED ORDER — ONDANSETRON HCL 4 MG/2ML IJ SOLN
4.0000 mg | Freq: Once | INTRAMUSCULAR | Status: AC
  Administered 2020-04-14: 4 mg via INTRAVENOUS

## 2020-04-14 MED ORDER — LIDOCAINE 2% (20 MG/ML) 5 ML SYRINGE
INTRAMUSCULAR | Status: DC | PRN
  Administered 2020-04-14: 100 mg via INTRAVENOUS

## 2020-04-14 MED ORDER — MAGNESIUM SULFATE 2 GM/50ML IV SOLN
2.0000 g | Freq: Once | INTRAVENOUS | Status: AC
  Administered 2020-04-14: 2 g via INTRAVENOUS
  Filled 2020-04-14: qty 50

## 2020-04-14 MED ORDER — SODIUM CHLORIDE 0.9 % IV SOLN: INTRAVENOUS | Status: DC

## 2020-04-14 MED ORDER — PANTOPRAZOLE SODIUM 40 MG PO TBEC
40.0000 mg | DELAYED_RELEASE_TABLET | Freq: Two times a day (BID) | ORAL | Status: DC
  Administered 2020-04-14 – 2020-04-15 (×3): 40 mg via ORAL
  Filled 2020-04-14 (×3): qty 1

## 2020-04-14 MED ORDER — MORPHINE SULFATE (PF) 2 MG/ML IV SOLN
2.0000 mg | INTRAVENOUS | Status: DC | PRN
  Administered 2020-04-14 (×2): 2 mg via INTRAVENOUS
  Filled 2020-04-14 (×2): qty 1

## 2020-04-14 MED ORDER — POTASSIUM CHLORIDE 10 MEQ/100ML IV SOLN
10.0000 meq | INTRAVENOUS | Status: AC
  Administered 2020-04-14 (×4): 10 meq via INTRAVENOUS
  Filled 2020-04-14 (×4): qty 100

## 2020-04-14 MED ORDER — SODIUM CHLORIDE 0.9% FLUSH
10.0000 mL | Freq: Two times a day (BID) | INTRAVENOUS | Status: DC
  Administered 2020-04-15 (×2): 10 mL

## 2020-04-14 MED ORDER — ACETAMINOPHEN 325 MG PO TABS
650.0000 mg | ORAL_TABLET | ORAL | Status: DC | PRN
  Administered 2020-04-15 (×2): 650 mg via ORAL
  Filled 2020-04-14 (×2): qty 2

## 2020-04-14 MED ORDER — ONDANSETRON HCL 4 MG/2ML IJ SOLN
INTRAMUSCULAR | Status: AC
  Filled 2020-04-14: qty 2

## 2020-04-14 MED ORDER — GLYCOPYRROLATE 0.2 MG/ML IJ SOLN
INTRAMUSCULAR | Status: DC | PRN
  Administered 2020-04-14: .2 mg via INTRAVENOUS

## 2020-04-14 MED ORDER — ALPRAZOLAM 0.5 MG PO TABS
0.5000 mg | ORAL_TABLET | Freq: Once | ORAL | Status: AC
  Administered 2020-04-14: 0.5 mg via ORAL
  Filled 2020-04-14: qty 1

## 2020-04-14 MED ORDER — OXYCODONE HCL 5 MG PO TABS
5.0000 mg | ORAL_TABLET | ORAL | Status: DC | PRN
  Administered 2020-04-14 – 2020-04-15 (×5): 5 mg via ORAL
  Filled 2020-04-14 (×5): qty 1

## 2020-04-14 MED ORDER — NICOTINE 21 MG/24HR TD PT24
21.0000 mg | MEDICATED_PATCH | Freq: Every day | TRANSDERMAL | Status: DC
  Administered 2020-04-14 – 2020-04-15 (×3): 21 mg via TRANSDERMAL
  Filled 2020-04-14 (×3): qty 1

## 2020-04-14 MED ORDER — PROPOFOL 500 MG/50ML IV EMUL
INTRAVENOUS | Status: AC
  Filled 2020-04-14: qty 50

## 2020-04-14 NOTE — Assessment & Plan Note (Signed)
-   unclear etiology; patient did endorse poor vision/needing new glasses and straining at home which may be contributing - will see if she can get established with [facility] for ongoing care and prescription assistance - she takes [facility] powder at home which has now caused recurrent gastric ulcer; also takes friends' percocet at home (no active prescription)

## 2020-04-14 NOTE — Anesthesia Preprocedure Evaluation (Addendum)
Anesthesia Evaluation  Patient identified by MRN, date of birth, ID band Patient awake    Reviewed: Allergy & Precautions, H&P , NPO status , Patient's Chart, lab work & pertinent test results, reviewed documented beta blocker date and time   Airway Mallampati: II   Neck ROM: full    Dental  (+) Edentulous Upper, Edentulous Lower   Pulmonary neg pulmonary ROS, Current Smoker,    Pulmonary exam normal        Cardiovascular Exercise Tolerance: Poor negative cardio ROS Normal cardiovascular exam Rhythm:regular Rate:Normal     Neuro/Psych negative neurological ROS  negative psych ROS   GI/Hepatic negative GI ROS, Neg liver ROS, (+) Hepatitis -  Endo/Other  negative endocrine ROS  Renal/GU negative Renal ROS  negative genitourinary   Musculoskeletal   Abdominal   Peds  Hematology negative hematology ROS (+) Blood dyscrasia, anemia ,   Anesthesia Other Findings Past Medical History: No date: Anemia     Comment:  History of anemia and menorrhagia, menorrhagia treated               with [employer]Sure No date: Hepatitis C Past Surgical History: No date: CESAREAN SECTION No date: NOVASURE ABLATION No date: TUBAL LIGATION BMI    Body Mass Index: 29.49 kg/m     Reproductive/Obstetrics negative OB ROS                            Anesthesia Physical Anesthesia Plan  ASA: II and emergent  Anesthesia Plan: General   Post-op Pain Management:    Induction:   PONV Risk Score and Plan:   Airway Management Planned:   Additional Equipment:   Intra-op Plan:   Post-operative Plan:   Informed Consent: I have reviewed the patients History and Physical, chart, labs and discussed the procedure including the risks, benefits and alternatives for the proposed anesthesia with the patient or authorized representative who has indicated his/her understanding and acceptance.     Dental Advisory  Given  Plan Discussed with: CRNA  Anesthesia Plan Comments:        Anesthesia Quick Evaluation

## 2020-04-14 NOTE — Progress Notes (Signed)
   04/14/20 1200  Mobility  Activity Off unit   Pt gone down to PACU.

## 2020-04-14 NOTE — Op Note (Signed)
[facility] Patient Name: Claudia Cantu Procedure Date: 04/14/2020 1:08 PM MRN: [id number] Account #: [id number] Date of Birth: April 12, 1971 Admit Type: Inpatient Age: 49 Room: [facility] ENDO ROOM 1 Gender: Female Note Status: Finalized Procedure:             Upper GI endoscopy Indications:           Hematochezia, Melena Providers:             Midge Minium MD, MD Referring MD:          none Medicines:             Propofol per Anesthesia Complications:         No immediate complications. Procedure:             Pre-Anesthesia Assessment:                        - Prior to the procedure, a History and Physical was                         performed, and patient medications and allergies were                         reviewed. The patient's tolerance of previous                         anesthesia was also reviewed. The risks and benefits                         of the procedure and the sedation options and risks                         were discussed with the patient. All questions were                         answered, and informed consent was obtained. Prior                         Anticoagulants: The patient has taken no previous                         anticoagulant or antiplatelet agents. ASA Grade                         Assessment: II - A patient with mild systemic disease.                         After reviewing the risks and benefits, the patient                         was deemed in satisfactory condition to undergo the                         procedure.                        After obtaining informed consent, the endoscope was                         passed under  direct vision. Throughout the procedure,                         the patient's blood pressure, pulse, and oxygen                         saturations were monitored continuously. The Endoscope                         was introduced through the mouth, and advanced to the                          second part of duodenum. The upper GI endoscopy was                         accomplished without difficulty. The patient tolerated                         the procedure well. Findings:      The examined esophagus was normal.      Severe inflammation characterized by erosions was found in the gastric       antrum.      One non-bleeding cratered gastric ulcer with a clean ulcer base (Forrest       Class III) was found at the pylorus.      The examined duodenum was normal. Impression:            - Normal esophagus.                        - Gastritis.                        - Non-bleeding gastric ulcer with no stigmata of                         bleeding.                        - Normal examined duodenum.                        - No specimens collected. Recommendation:        - Return patient to hospital ward for ongoing care.                        - Resume regular diet.                        - Continue present medications.                        - Low risk of rebleeding with a clean base ulcer.                        - Avoid NSAID's Procedure Code(s):     --- Professional ---                        [phone], Esophagogastroduodenoscopy, flexible,  transoral; diagnostic, including collection of                         specimen(s) by brushing or washing, when performed                         (separate procedure) Diagnosis Code(s):     --- Professional ---                        K92.1, Melena (includes Hematochezia)                        K29.70, Gastritis, unspecified, without bleeding                        K25.9, Gastric ulcer, unspecified as acute or chronic,                         without hemorrhage or perforation CPT copyright 2019 American Medical Association. All rights reserved. The codes documented in this report are preliminary and upon coder review may  be revised to meet current compliance requirements. Midge Minium MD, MD 04/14/2020 1:48:11 PM This  report has been signed electronically. Number of Addenda: 0 Note Initiated On: 04/14/2020 1:08 PM Estimated Blood Loss:  Estimated blood loss: none.      [facility]

## 2020-04-14 NOTE — Progress Notes (Signed)
PROGRESS NOTE    Claudia Cantu   IDP:[id number]  DOB: September 22, 1970  DOA: 04/12/2020     1  PCP: Patient, No Pcp Per  CC: hematemesis, BRBPR  Hospital Course: Claudia Cantu is a 49 yo CF with PMH chronic headaches with ongoing BC powder use, PUD, hepatitis C who presented to the ER with abdominal pain, hematemesis, and bright red blood per rectum.  She was brought to the hospital by EMS who had also noted some melanotic stools. Initial hemoglobin was 9.4, she was prepared for possible blood transfusion due to the amount of described blood loss however hemoglobin remained relatively stable with no further significant drop and she also remained hemodynamically stable. She was started on a Protonix drip and IV fluids.  Given her ongoing use of BC powders at home, this was considered likely contributing to an underlying recurrent ulcer.  GI was consulted and tentative plans are for EGD on 04/14/2020.   Interval History:  Underwent EGD today.  Gastric ulcer clean based noted.  She was strongly recommended to refrain from ongoing NSAID use at home.  We discussed trying to get her set up with the wellness center in [place] so that she has medical care and access to affordable prescriptions.  Old records reviewed in assessment of this patient  ROS: Constitutional: negative for chills and fevers, Respiratory: negative for cough, Cardiovascular: negative for chest pain and Gastrointestinal: positive for abdominal pain  Assessment & Plan: Upper GI bleed -Suspected recurrent peptic ulcer given ongoing chronic use of BC powders at home.  She states using at least 2/day.  She has ongoing chronic headaches that she is treating -CT abdomen/pelvis performed on admission which was negative for active bleeding - Hgb has remained stable - s/p EGD on 9/18: severe inflammation with gastric antrum erosions; 1 non-bleeding cratered gastric ulcer (clean based) - d/c protonix gtt; start protonix PO BID - diet  resumed - again discussed importance this am with patient regarding refraining from [facility] powders  Acute gastric ulcer without hemorrhage or perforation - see upper GIB  Chronic headaches - unclear etiology; patient did endorse poor vision/needing new glasses and straining at home which may be contributing - will see if she can get established with [facility] for ongoing care and prescription assistance - she takes [facility] powder at home which has now caused recurrent gastric ulcer; also takes friends' percocet at home (no active prescription)  Benzodiazepine dependence (HCC)  - patient states she takes friends klonopin almost nightly at home; to prevent withdrawal will continue for now PRN at night while inpatient; strongly informed her to stop/wean off   Antimicrobials: Rocephin 04/13/2020>> present  DVT prophylaxis: SCD Code Status: Full Family Communication: None present Disposition Plan: Status is: Inpatient  Remains inpatient appropriate because:Ongoing diagnostic testing needed not appropriate for outpatient work up, Unsafe d/c plan, IV treatments appropriate due to intensity of illness or inability to take PO and Inpatient level of care appropriate due to severity of illness   Dispo: The patient is from: Home              Anticipated d/c is to: Home              Anticipated d/c date is: 2 days              Patient currently is not medically stable to d/c.  Objective: Blood pressure 129/82, pulse 90, temperature 98.1 F (36.7 C), temperature source Oral, resp. rate 18, height 5' (  1.524 m), weight 68.5 kg, SpO2 100 %.  Examination: General appearance: Adult woman lying in bed appearing uncomfortable but in no obvious distress Head: Normocephalic, without obvious abnormality, atraumatic Eyes: EOMI Lungs: clear to auscultation bilaterally Heart: regular rate and rhythm and S1, S2 normal Abdomen: Soft, nondistended.  Tender throughout but no rebound or guarding.  Bowel  sounds present Extremities: No edema Skin: mobility and turgor normal Neurologic: Grossly normal  Consultants:   GI  Procedures:   04/14/20, EGD  Data Reviewed: I have personally reviewed following labs and imaging studies Results for orders placed or performed during the hospital encounter of 04/12/20 (from the past 24 hour(s))  Hemoglobin and hematocrit, blood     Status: Abnormal   Collection Time: 04/13/20  8:22 PM  Result Value Ref Range   Hemoglobin 8.2 (L) 12.0 - 15.0 g/dL   HCT 60.423.6 (L) 36 - 46 %  Basic metabolic panel     Status: Abnormal   Collection Time: 04/14/20  6:45 AM  Result Value Ref Range   Sodium 140 135 - 145 mmol/L   Potassium 3.2 (L) 3.5 - 5.1 mmol/L   Chloride 110 98 - 111 mmol/L   CO2 23 22 - 32 mmol/L   Glucose, Bld 95 70 - 99 mg/dL   BUN 8 6 - 20 mg/dL   Creatinine, Ser 5.400.59 0.44 - 1.00 mg/dL   Calcium 8.0 (L) 8.9 - 10.3 mg/dL   GFR calc non Af Amer >60 >60 mL/min   GFR calc Af Amer >60 >60 mL/min   Anion gap 7 5 - 15  CBC with Differential/Platelet     Status: Abnormal   Collection Time: 04/14/20  6:45 AM  Result Value Ref Range   WBC 7.0 4.0 - 10.5 K/uL   RBC 2.83 (L) 3.87 - 5.11 MIL/uL   Hemoglobin 8.7 (L) 12.0 - 15.0 g/dL   HCT 98.124.1 (L) 36 - 46 %   MCV 85.2 80.0 - 100.0 fL   MCH 30.7 26.0 - 34.0 pg   MCHC 36.1 (H) 30.0 - 36.0 g/dL   RDW 19.114.4 47.811.5 - 29.515.5 %   Platelets 125 (L) 150 - 400 K/uL   nRBC 0.0 0.0 - 0.2 %   Neutrophils Relative % 73 %   Neutro Abs 5.0 1.7 - 7.7 K/uL   Lymphocytes Relative 21 %   Lymphs Abs 1.5 0.7 - 4.0 K/uL   Monocytes Relative 6 %   Monocytes Absolute 0.4 0 - 1 K/uL   Eosinophils Relative 0 %   Eosinophils Absolute 0.0 0 - 0 K/uL   Basophils Relative 0 %   Basophils Absolute 0.0 0 - 0 K/uL   Immature Granulocytes 0 %   Abs Immature Granulocytes 0.02 0.00 - 0.07 K/uL  Magnesium     Status: Abnormal   Collection Time: 04/14/20  6:45 AM  Result Value Ref Range   Magnesium 1.6 (L) 1.7 - 2.4 mg/dL      Recent Results (from the past 240 hour(s))  SARS Coronavirus 2 by RT PCR (hospital order, performed in [facility] lab) Nasopharyngeal Nasopharyngeal Swab     Status: None   Collection Time: 04/12/20 10:56 PM   Specimen: Nasopharyngeal Swab  Result Value Ref Range Status   SARS Coronavirus 2 NEGATIVE NEGATIVE Final    Comment: (NOTE) SARS-CoV-2 target nucleic acids are NOT DETECTED.  The SARS-CoV-2 RNA is generally detectable in upper and lower respiratory specimens during the acute phase of infection. The lowest concentration of  SARS-CoV-2 viral copies this assay can detect is 250 copies / mL. A negative result does not preclude SARS-CoV-2 infection and should not be used as the sole basis for treatment or other patient management decisions.  A negative result may occur with improper specimen collection / handling, submission of specimen other than nasopharyngeal swab, presence of viral mutation(s) within the areas targeted by this assay, and inadequate number of viral copies (<250 copies / mL). A negative result must be combined with clinical observations, patient history, and epidemiological information.  Fact Sheet for Patients:   BoilerBrush.com.cy  Fact Sheet for Healthcare Providers: https://pope.com/  This test is not yet approved or  cleared by the [place] FDA and has been authorized for detection and/or diagnosis of SARS-CoV-2 by FDA under an Emergency Use Authorization (EUA).  This EUA will remain in effect (meaning this test can be used) for the duration of the COVID-19 declaration under Section 564(b)(1) of the Act, 21 U.S.C. section 360bbb-3(b)(1), unless the authorization is terminated or revoked sooner.  Performed at [facility], [address] [place], [place] [address]      Radiology Studies: CT Angio Abd/Pel w/ and/or w/o  Result Date: 04/13/2020 CLINICAL DATA:  Vomiting,  hematemesis and bright red blood per rectum EXAM: CTA ABDOMEN AND PELVIS WITHOUT AND WITH CONTRAST TECHNIQUE: Multidetector CT imaging of the abdomen and pelvis was performed using the standard protocol during bolus administration of intravenous contrast. Multiplanar reconstructed images and MIPs were obtained and reviewed to evaluate the vascular anatomy. CONTRAST:  OMNIPAQUE IOHEXOL 350 MG/ML SOLN COMPARISON:  04/01/2017 FINDINGS: VASCULAR Aorta: Normal caliber aorta without aneurysm, dissection, vasculitis or significant stenosis. Celiac: Patent without evidence of aneurysm, dissection, vasculitis or significant stenosis. SMA: Patent without evidence of aneurysm, dissection, vasculitis or significant stenosis. Renals: Both renal arteries are patent without evidence of aneurysm, dissection, vasculitis, fibromuscular dysplasia or significant stenosis. IMA: Patent without evidence of aneurysm, dissection, vasculitis or significant stenosis. Inflow: Patent without evidence of aneurysm, dissection, vasculitis or significant stenosis. Proximal Outflow: Bilateral common femoral and visualized portions of the superficial and profunda femoral arteries are patent without evidence of aneurysm, dissection, vasculitis or significant stenosis. Veins: No obvious venous abnormality within the limitations of this arterial phase study. Review of the MIP images confirms the above findings. NON-VASCULAR Lower chest: No acute pleural or parenchymal lung disease. Hepatobiliary: No focal liver abnormality is seen. No gallstones, gallbladder wall thickening, or biliary dilatation. Pancreas: Unremarkable. No pancreatic ductal dilatation or surrounding inflammatory changes. Spleen: Normal in size without focal abnormality. Adrenals/Urinary Tract: Adrenal glands are unremarkable. Kidneys are normal, without renal calculi, focal lesion, or hydronephrosis. Bladder is unremarkable. Stomach/Bowel: No bowel obstruction or ileus. There is  diffuse colonic wall thickening, consistent with inflammatory or infectious colitis. The appendix is normal. There is no intraluminal accumulation of contrast to suggest active gastrointestinal bleeding. Lymphatic: No pathologic adenopathy. Reproductive: Uterus and bilateral adnexa are unremarkable. Other: No free fluid or free gas.  No abdominal wall hernia. Musculoskeletal: No acute or destructive bony lesions. Reconstructed images demonstrate no additional findings. IMPRESSION: VASCULAR 1. No evidence of active gastrointestinal bleeding. NON-VASCULAR 1. Diffuse colonic wall thickening compatible with inflammatory or infectious colitis. Electronically Signed   By: Sharlet Salina M.D.   On: 04/13/2020 00:19   CT Angio Abd/Pel w/ and/or w/o  Final Result      Scheduled Meds: . nicotine  21 mg Transdermal Daily  . ondansetron      . pantoprazole  40 mg  Oral BID   PRN Meds: acetaminophen **OR** [DISCONTINUED] acetaminophen, ALPRAZolam, ondansetron **OR** ondansetron (ZOFRAN) IV, oxyCODONE Continuous Infusions: . cefTRIAXone (ROCEPHIN)  IV Stopped (04/13/20 2101)  . metronidazole 500 mg (04/14/20 0848)      LOS: 1 day  Time spent: Greater than 50% of the 35 minute visit was spent in counseling/coordination of care for the patient as laid out in the A&P.   Lewie Chamber, MD [facility] 04/14/2020, 3:21 PM  Contact via secure chat.  To contact the attending provider between 7A-7P or the covering provider during after hours 7P-7A, please log into the web site [email or web address] and access using universal [place] password for that web site. If you do not have the password, please call the hospital operator.

## 2020-04-14 NOTE — Assessment & Plan Note (Signed)
-   see upper GIB

## 2020-04-14 NOTE — Assessment & Plan Note (Signed)
-   patient states she takes friends klonopin almost nightly at home; to prevent withdrawal will continue for now PRN at night while inpatient; strongly informed her to stop/wean off

## 2020-04-14 NOTE — ED Notes (Signed)
Pt ambulatory to bathroom. Pt had 1x episode of diarrhoea.

## 2020-04-14 NOTE — ED Notes (Signed)
Pt ambulatory to bathroom

## 2020-04-14 NOTE — ED Notes (Signed)
Pt ambulatory to bathroom. Pt has x1 episode of bloody diarrhoea.

## 2020-04-14 NOTE — ED Notes (Signed)
Pt ambulatory to bathroom. Pt had 1x episode of bloody diarrhoea.

## 2020-04-14 NOTE — Transfer of Care (Signed)
Immediate Anesthesia Transfer of Care Note  Patient: Claudia Cantu  Procedure(s) Performed: ESOPHAGOGASTRODUODENOSCOPY (EGD) WITH PROPOFOL (N/A )  Patient Location: Endoscopy Unit  Anesthesia Type:General  Level of Consciousness: sedated  Airway & Oxygen Therapy: Patient connected to nasal cannula oxygen  Post-op Assessment: Post -op Vital signs reviewed and stable  Post vital signs: stable  Last Vitals:  Vitals Value Taken Time  BP 106/69 04/14/20 1350  Temp    Pulse 112 04/14/20 1351  Resp 15 04/14/20 1351  SpO2 100 % 04/14/20 1351  Vitals shown include unvalidated device data.  Last Pain:  Vitals:   04/14/20 1325  TempSrc: Temporal  PainSc:          Complications: No complications documented.

## 2020-04-15 LAB — CBC WITH DIFFERENTIAL/PLATELET
Abs Immature Granulocytes: 0.02 10*3/uL (ref 0.00–0.07)
Basophils Absolute: 0 10*3/uL (ref 0.0–0.1)
Basophils Relative: 1 %
Eosinophils Absolute: 0.1 10*3/uL (ref 0.0–0.5)
Eosinophils Relative: 2 %
HCT: 26.3 % — ABNORMAL LOW (ref 36.0–46.0)
Hemoglobin: 8.8 g/dL — ABNORMAL LOW (ref 12.0–15.0)
Immature Granulocytes: 0 %
Lymphocytes Relative: 26 %
Lymphs Abs: 1.6 10*3/uL (ref 0.7–4.0)
MCH: 29.6 pg (ref 26.0–34.0)
MCHC: 33.5 g/dL (ref 30.0–36.0)
MCV: 88.6 fL (ref 80.0–100.0)
Monocytes Absolute: 0.4 10*3/uL (ref 0.1–1.0)
Monocytes Relative: 7 %
Neutro Abs: 3.9 10*3/uL (ref 1.7–7.7)
Neutrophils Relative %: 64 %
Platelets: 145 10*3/uL — ABNORMAL LOW (ref 150–400)
RBC: 2.97 MIL/uL — ABNORMAL LOW (ref 3.87–5.11)
RDW: 14.4 % (ref 11.5–15.5)
WBC: 6.1 10*3/uL (ref 4.0–10.5)
nRBC: 0 % (ref 0.0–0.2)

## 2020-04-15 LAB — BASIC METABOLIC PANEL
Anion gap: 8 (ref 5–15)
BUN: <5 mg/dL — ABNORMAL LOW (ref 6–20)
CO2: 27 mmol/L (ref 22–32)
Calcium: 8.2 mg/dL — ABNORMAL LOW (ref 8.9–10.3)
Chloride: 107 mmol/L (ref 98–111)
Creatinine, Ser: 0.82 mg/dL (ref 0.44–1.00)
GFR calc Af Amer: >60 mL/min (ref >60)
GFR calc non Af Amer: >60 mL/min (ref >60)
Glucose, Bld: 97 mg/dL (ref 70–99)
Potassium: 3.3 mmol/L — ABNORMAL LOW (ref 3.5–5.1)
Sodium: 142 mmol/L (ref 135–145)

## 2020-04-15 LAB — MAGNESIUM: Magnesium: 1.9 mg/dL (ref 1.7–2.4)

## 2020-04-15 MED ORDER — POTASSIUM CHLORIDE CRYS ER 20 MEQ PO TBCR
40.0000 meq | EXTENDED_RELEASE_TABLET | Freq: Once | ORAL | Status: AC
  Administered 2020-04-15: 40 meq via ORAL
  Filled 2020-04-15: qty 2

## 2020-04-15 MED ORDER — PANTOPRAZOLE SODIUM 40 MG PO TBEC: 40.0000 mg | DELAYED_RELEASE_TABLET | Freq: Two times a day (BID) | ORAL | 5 refills | Status: AC

## 2020-04-15 NOTE — Plan of Care (Signed)

## 2020-04-15 NOTE — Progress Notes (Signed)
Patient discharged to home with family.  Tele and IV d/c'd prior to discharge. Patient verbalizes understanding of discharge instructions. 

## 2020-04-15 NOTE — Discharge Summary (Signed)
Physician Discharge Summary  Claudia Cantu WIO:[id number] DOB: [birth year] DOA: 04/12/2020  PCP: Patient, No Pcp Per  Admit date: 04/12/2020 Discharge date: 04/15/2020  Admitted From: Home Disposition:  Home Discharging physician: Lewie Chamber, MD  Recommendations for Outpatient Follow-up:  1. Establish with [facility]  2. Abstinence from NSAIDs, BC powders    Patient discharged to home in Discharge Condition: stable CODE STATUS: Full Diet recommendation:  Diet Orders (From admission, onward)    Start     Ordered   04/14/20 1432  Diet regular Room service appropriate? Yes; Fluid consistency: Thin  Diet effective now       Question Answer Comment  Room service appropriate? Yes   Fluid consistency: Thin      04/14/20 1431          Hospital Course: Claudia Cantu is a 49 yo CF with PMH chronic headaches with ongoing BC powder use, PUD, hepatitis C who presented to the ER with abdominal pain, hematemesis, and bright red blood per rectum.  She was brought to the hospital by EMS who had also noted some melanotic stools. Initial hemoglobin was 9.4, she was prepared for possible blood transfusion due to the amount of described blood loss however hemoglobin remained relatively stable with no further significant drop and she also remained hemodynamically stable. She was started on a Protonix drip and IV fluids.  Given her ongoing use of BC powders at home, this was considered likely contributing to an underlying recurrent ulcer.  GI was consulted and tentative plans are for EGD on 04/14/2020. She was found to have a clean-based gastric ulcer.  She had severe inflammation in the gastric antrum.  She was again emphasized the importance to remain off of BC powders and other NSAIDs at discharge.  She was given a prescription for Protonix 40 mg twice daily at discharge. She did endorse difficulty with obtaining medications and does not have a routine doctor.  She was given a [facility] and states that she will follow up. Her hemoglobin remained stable with further trending and monitoring.  She had no further hematemesis nor rectal bleeding and was considered stable for discharging home.   Upper GI bleed -Suspected recurrent peptic ulcer given ongoing chronic use of BC powders at home.  She states using at least 2/day.  She has ongoing chronic headaches that she is treating -CT abdomen/pelvis performed on admission which was negative for active bleeding - Hgb has remained stable - s/p EGD on 9/18: severe inflammation with gastric antrum erosions; 1 non-bleeding cratered gastric ulcer (clean based) - d/c protonix gtt; start protonix PO BID - diet resumed - again discussed importance this am with patient regarding refraining from [facility] powders  Acute gastric ulcer without hemorrhage or perforation - see upper GIB  Chronic headaches - unclear etiology; patient did endorse poor vision/needing new glasses and straining at home which may be contributing - will see if she can get established with [place] [facility] for ongoing care and prescription assistance - she takes [facility] powder at home which has now caused recurrent gastric ulcer; also takes friends' percocet at home (no active prescription)  Benzodiazepine dependence (HCC)  - patient states she takes friends klonopin almost nightly at home; to prevent withdrawal will continue for now PRN at night while inpatient; strongly informed her to stop/wean off    The patient's chronic medical conditions were treated accordingly per the patient's home medication regimen except as noted.  On  day of discharge, patient was felt deemed stable for discharge. Patient/family member advised to call PCP or come back to ER if needed.   Discharge Diagnoses:   Principal Diagnosis: <principal problem not specified>  Active Hospital Problems   Diagnosis Date Noted  . Upper GI bleed 04/13/2020    Priority: High  .  Chronic headaches 04/14/2020  . Benzodiazepine dependence (HCC) 04/14/2020  . Blood in stool   . Acute gastritis with hemorrhage   . Acute gastric ulcer without hemorrhage or perforation     Resolved Hospital Problems  No resolved problems to display.    Discharge Instructions    Increase activity slowly   Complete by: As directed      Allergies as of 04/15/2020      Reactions   Amoxicillin Shortness Of Breath   Penicillins Nausea And Vomiting   Did it involve swelling of the face/tongue/throat, SOB, or low BP? No Did it involve sudden or severe rash/hives, skin peeling, or any reaction on the inside of your mouth or nose? No Did you need to seek medical attention at a hospital or doctor's office? No When did it last happen? If all above answers are "NO", may proceed with cephalosporin use.   Codeine Rash      Medication List    TAKE these medications   pantoprazole 40 MG tablet Commonly known as: PROTONIX Take 1 tablet (40 mg total) by mouth 2 (two) times daily.       Allergies  Allergen Reactions  . Amoxicillin Shortness Of Breath  . Penicillins Nausea And Vomiting    Did it involve swelling of the face/tongue/throat, SOB, or low BP? No Did it involve sudden or severe rash/hives, skin peeling, or any reaction on the inside of your mouth or nose? No Did you need to seek medical attention at a hospital or doctor's office? No When did it last happen? If all above answers are "NO", may proceed with cephalosporin use.   . Codeine Rash    Consultations: GI  Discharge Exam: BP 131/79 (BP Location: Right Arm)   Pulse 63   Temp 98.5 F (36.9 C) (Oral)   Resp 17   Ht 5' (1.524 m)   Wt 65.6 kg   SpO2 100%   BMI 28.26 kg/m  General appearance: Adult woman laying in bed appearing comfortable with no further abdominal pain Head: Normocephalic, without obvious abnormality, atraumatic Eyes: EOMI Lungs: clear to auscultation bilaterally Heart: regular  rate and rhythm and S1, S2 normal Abdomen: Soft, nondistended. Minimal TTP.  Bowel sounds present Extremities: No edema Skin: mobility and turgor normal Neurologic: Grossly normal  The results of significant diagnostics from this hospitalization (including imaging, microbiology, ancillary and laboratory) are listed below for reference.   Microbiology: Recent Results (from the past 240 hour(s))  SARS Coronavirus 2 by RT PCR (hospital order, performed in [facility] hospital lab) Nasopharyngeal Nasopharyngeal Swab     Status: None   Collection Time: 04/12/20 10:56 PM   Specimen: Nasopharyngeal Swab  Result Value Ref Range Status   SARS Coronavirus 2 NEGATIVE NEGATIVE Final    Comment: (NOTE) SARS-CoV-2 target nucleic acids are NOT DETECTED.  The SARS-CoV-2 RNA is generally detectable in upper and lower respiratory specimens during the acute phase of infection. The lowest concentration of SARS-CoV-2 viral copies this assay can detect is 250 copies / mL. A negative result does not preclude SARS-CoV-2 infection and should not be used as the sole basis for treatment or other patient  management decisions.  A negative result may occur with improper specimen collection / handling, submission of specimen other than nasopharyngeal swab, presence of viral mutation(s) within the areas targeted by this assay, and inadequate number of viral copies (<250 copies / mL). A negative result must be combined with clinical observations, patient history, and epidemiological information.  Fact Sheet for Patients:   BoilerBrush.com.cyhttps://www.fda.gov/media/136312/download  Fact Sheet for Healthcare Providers: https://pope.com/https://www.fda.gov/media/136313/download  This test is not yet approved or  cleared by the [place] FDA and has been authorized for detection and/or diagnosis of SARS-CoV-2 by FDA under an Emergency Use Authorization (EUA).  This EUA will remain in effect (meaning this test can be used) for the duration of  the COVID-19 declaration under Section 564(b)(1) of the Act, 21 U.S.C. section 360bbb-3(b)(1), unless the authorization is terminated or revoked sooner.  Performed at [facility], [address] [place], [place] [address]      Labs: BNP (last 3 results) No results for input(s): BNP in the last 8760 hours. Basic Metabolic Panel: Recent Labs  Lab 04/12/20 2245 04/13/20 0547 04/14/20 0645 04/15/20 0401  NA 140 138 140 142  K 3.4* 3.8 3.2* 3.3*  CL 107 106 110 107  CO2 20* 24 23 27   GLUCOSE 238* 123* 95 97  BUN 33* 25* 8 <5*  CREATININE 0.88 0.66 0.59 0.82  CALCIUM 8.3* 7.8* 8.0* 8.2*  MG  --   --  1.6* 1.9   Liver Function Tests: Recent Labs  Lab 04/12/20 2245  AST 22  ALT 11  ALKPHOS 48  BILITOT 0.4  PROT 6.1*  ALBUMIN 3.5   Recent Labs  Lab 04/12/20 2245  LIPASE 22   No results for input(s): AMMONIA in the last 168 hours. CBC: Recent Labs  Lab 04/12/20 2245 04/13/20 0547 04/13/20 2022 04/14/20 0645 04/15/20 0401  WBC 15.8* 12.5*  --  7.0 6.1  NEUTROABS 13.3*  --   --  5.0 3.9  HGB 9.4* 10.8* 8.2* 8.7* 8.8*  HCT 27.6* 31.0* 23.6* 24.1* 26.3*  MCV 87.9 87.6  --  85.2 88.6  PLT 344 138*  --  125* 145*   Cardiac Enzymes: No results for input(s): CKTOTAL, CKMB, CKMBINDEX, TROPONINI in the last 168 hours. BNP: Invalid input(s): POCBNP CBG: No results for input(s): GLUCAP in the last 168 hours. D-Dimer No results for input(s): DDIMER in the last 72 hours. Hgb A1c No results for input(s): HGBA1C in the last 72 hours. Lipid Profile No results for input(s): CHOL, HDL, LDLCALC, TRIG, CHOLHDL, LDLDIRECT in the last 72 hours. Thyroid function studies No results for input(s): TSH, T4TOTAL, T3FREE, THYROIDAB in the last 72 hours.  Invalid input(s): FREET3 Anemia work up No results for input(s): VITAMINB12, FOLATE, FERRITIN, TIBC, IRON, RETICCTPCT in the last 72 hours. Urinalysis    Component Value Date/Time   COLORURINE YELLOW (A)  04/13/2020 0914   APPEARANCEUR CLEAR (A) 04/13/2020 0914   APPEARANCEUR Clear 02/17/2014 1524   LABSPEC 1.032 (H) 04/13/2020 0914   LABSPEC 1.016 02/17/2014 1524   PHURINE 5.0 04/13/2020 0914   GLUCOSEU NEGATIVE 04/13/2020 0914   GLUCOSEU Negative 02/17/2014 1524   HGBUR MODERATE (A) 04/13/2020 0914   BILIRUBINUR NEGATIVE 04/13/2020 0914   BILIRUBINUR Negative 02/17/2014 1524   KETONESUR NEGATIVE 04/13/2020 0914   PROTEINUR NEGATIVE 04/13/2020 0914   NITRITE NEGATIVE 04/13/2020 0914   LEUKOCYTESUR NEGATIVE 04/13/2020 0914   LEUKOCYTESUR Negative 02/17/2014 1524   Sepsis Labs Invalid input(s): PROCALCITONIN,  WBC,  LACTICIDVEN Microbiology Recent Results (  from the past 240 hour(s))  SARS Coronavirus 2 by RT PCR (hospital order, performed in [facility] hospital lab) Nasopharyngeal Nasopharyngeal Swab     Status: None   Collection Time: 04/12/20 10:56 PM   Specimen: Nasopharyngeal Swab  Result Value Ref Range Status   SARS Coronavirus 2 NEGATIVE NEGATIVE Final    Comment: (NOTE) SARS-CoV-2 target nucleic acids are NOT DETECTED.  The SARS-CoV-2 RNA is generally detectable in upper and lower respiratory specimens during the acute phase of infection. The lowest concentration of SARS-CoV-2 viral copies this assay can detect is 250 copies / mL. A negative result does not preclude SARS-CoV-2 infection and should not be used as the sole basis for treatment or other patient management decisions.  A negative result may occur with improper specimen collection / handling, submission of specimen other than nasopharyngeal swab, presence of viral mutation(s) within the areas targeted by this assay, and inadequate number of viral copies (<250 copies / mL). A negative result must be combined with clinical observations, patient history, and epidemiological information.  Fact Sheet for Patients:   BoilerBrush.com.cy  Fact Sheet for Healthcare  Providers: https://pope.com/  This test is not yet approved or  cleared by the [place] FDA and has been authorized for detection and/or diagnosis of SARS-CoV-2 by FDA under an Emergency Use Authorization (EUA).  This EUA will remain in effect (meaning this test can be used) for the duration of the COVID-19 declaration under Section 564(b)(1) of the Act, 21 U.S.C. section 360bbb-3(b)(1), unless the authorization is terminated or revoked sooner.  Performed at [facility], [address] [place], [place] [address]     Procedures/Studies: CT Angio Abd/Pel w/ and/or w/o  Result Date: 04/13/2020 CLINICAL DATA:  Vomiting, hematemesis and bright red blood per rectum EXAM: CTA ABDOMEN AND PELVIS WITHOUT AND WITH CONTRAST TECHNIQUE: Multidetector CT imaging of the abdomen and pelvis was performed using the standard protocol during bolus administration of intravenous contrast. Multiplanar reconstructed images and MIPs were obtained and reviewed to evaluate the vascular anatomy. CONTRAST:  OMNIPAQUE IOHEXOL 350 MG/ML SOLN COMPARISON:  04/01/2017 FINDINGS: VASCULAR Aorta: Normal caliber aorta without aneurysm, dissection, vasculitis or significant stenosis. Celiac: Patent without evidence of aneurysm, dissection, vasculitis or significant stenosis. SMA: Patent without evidence of aneurysm, dissection, vasculitis or significant stenosis. Renals: Both renal arteries are patent without evidence of aneurysm, dissection, vasculitis, fibromuscular dysplasia or significant stenosis. IMA: Patent without evidence of aneurysm, dissection, vasculitis or significant stenosis. Inflow: Patent without evidence of aneurysm, dissection, vasculitis or significant stenosis. Proximal Outflow: Bilateral common femoral and visualized portions of the superficial and profunda femoral arteries are patent without evidence of aneurysm, dissection, vasculitis or significant stenosis. Veins:  No obvious venous abnormality within the limitations of this arterial phase study. Review of the MIP images confirms the above findings. NON-VASCULAR Lower chest: No acute pleural or parenchymal lung disease. Hepatobiliary: No focal liver abnormality is seen. No gallstones, gallbladder wall thickening, or biliary dilatation. Pancreas: Unremarkable. No pancreatic ductal dilatation or surrounding inflammatory changes. Spleen: Normal in size without focal abnormality. Adrenals/Urinary Tract: Adrenal glands are unremarkable. Kidneys are normal, without renal calculi, focal lesion, or hydronephrosis. Bladder is unremarkable. Stomach/Bowel: No bowel obstruction or ileus. There is diffuse colonic wall thickening, consistent with inflammatory or infectious colitis. The appendix is normal. There is no intraluminal accumulation of contrast to suggest active gastrointestinal bleeding. Lymphatic: No pathologic adenopathy. Reproductive: Uterus and bilateral adnexa are unremarkable. Other: No free fluid or free gas.  No abdominal  wall hernia. Musculoskeletal: No acute or destructive bony lesions. Reconstructed images demonstrate no additional findings. IMPRESSION: VASCULAR 1. No evidence of active gastrointestinal bleeding. NON-VASCULAR 1. Diffuse colonic wall thickening compatible with inflammatory or infectious colitis. Electronically Signed   By: Sharlet Salina M.D.   On: 04/13/2020 00:19     Time coordinating discharge: Over 30 minutes    Lewie Chamber, MD  [facility] 04/15/2020, 2:24 PM Pager: Secure chat  If 7PM-7AM, please contact night-coverage [email or web address] Password [email or web address]

## 2020-04-16 ENCOUNTER — Encounter: Payer: Self-pay | Admitting: Gastroenterology

## 2020-04-17 NOTE — Anesthesia Postprocedure Evaluation (Signed)
Anesthesia Post Note  Patient: ONETHA GAFFEY  Procedure(s) Performed: ESOPHAGOGASTRODUODENOSCOPY (EGD) WITH PROPOFOL (N/A )  Patient location during evaluation: PACU Anesthesia Type: General Level of consciousness: awake and alert Pain management: pain level controlled Vital Signs Assessment: post-procedure vital signs reviewed and stable Respiratory status: spontaneous breathing, nonlabored ventilation, respiratory function stable and patient connected to nasal cannula oxygen Cardiovascular status: blood pressure returned to baseline and stable Postop Assessment: no apparent nausea or vomiting Anesthetic complications: no   No complications documented.   Last Vitals:  Vitals:   04/15/20 0354 04/15/20 0801  BP: 131/72 131/79  Pulse: 72 63  Resp:  17  Temp: 36.7 C 36.9 C  SpO2: 100% 100%    Last Pain:  Vitals:   04/15/20 0801  TempSrc: Oral  PainSc: 9                  Yevette Edwards

## 2022-02-16 IMAGING — CT CT CTA ABD/PEL W/CM AND/OR W/O CM
3 of 10 series · 11 of 46 positions shown, 17 images · IV contrast (omnipaque)
Comparison: 04/01/2017

CLINICAL DATA: Vomiting, hematemesis and bright red blood per
rectum

EXAM:
CTA ABDOMEN AND PELVIS WITHOUT AND WITH CONTRAST
TECHNIQUE: Multidetector CT imaging of the abdomen and pelvis was performed
using the standard protocol during bolus administration of
intravenous contrast. Multiplanar reconstructed images and MIPs were
obtained and reviewed to evaluate the vascular anatomy.
CONTRAST:  100mL OMNIPAQUE IOHEXOL 350 MG/ML SOLN

[Series 5: axial arterial · axial · arterial · 0.87mm/px · z∈[-343,-111]mm · 5 of 232 slices shown]
[im 17/232  soft-tissue]
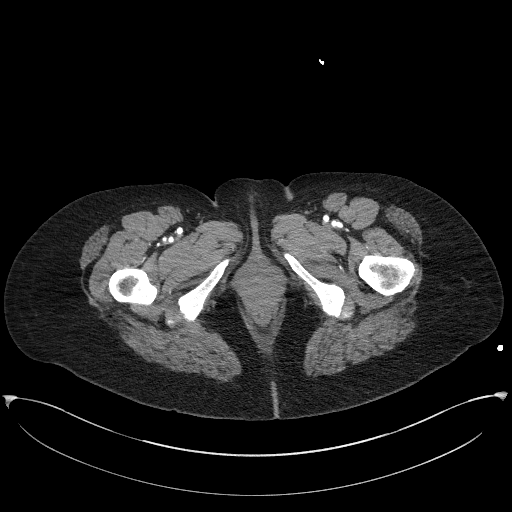
[im 50/232  soft-tissue]
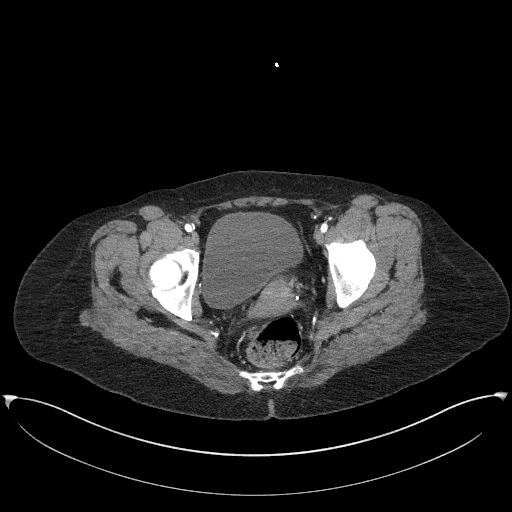
[im 83/232  soft-tissue]
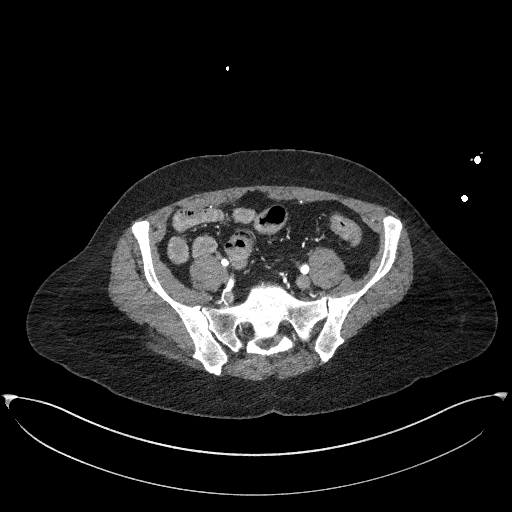
[im 100/232  soft-tissue]
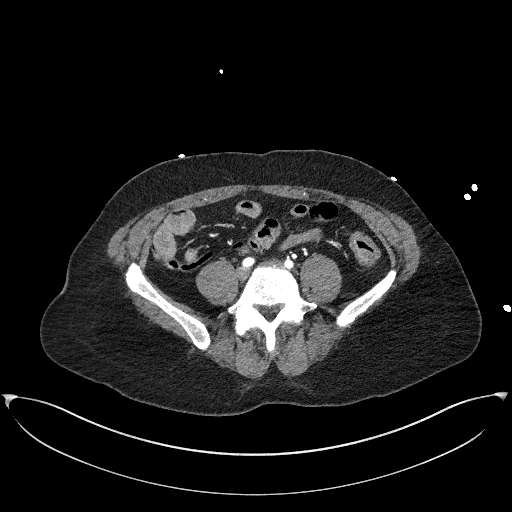
[im 133/232  soft-tissue]
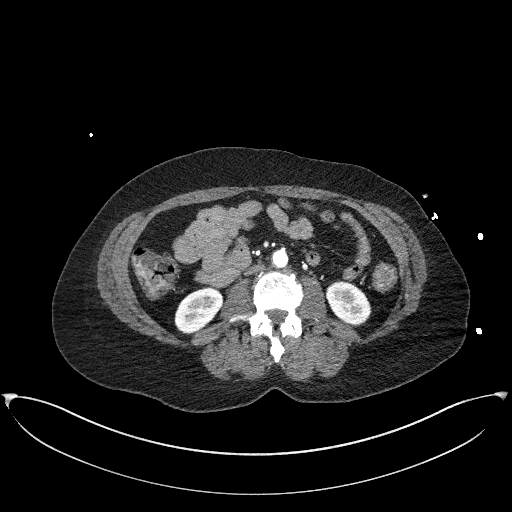

[Series 6: axial venous · axial · portal-venous · 0.87mm/px · z∈[-283,-8]mm · 4 of 93 slices shown, 9 images]
[im 19/93  soft-tissue]
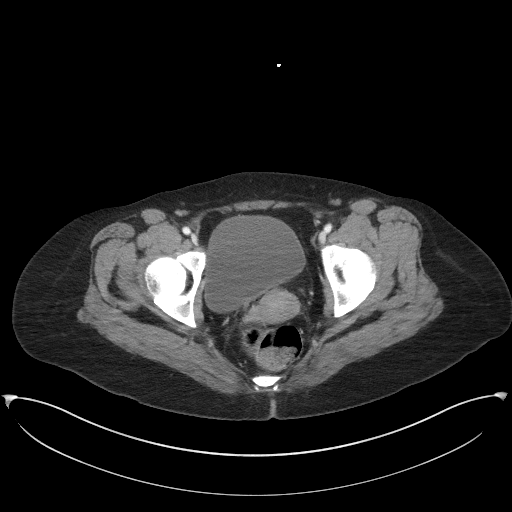
[im 19/93  lung]
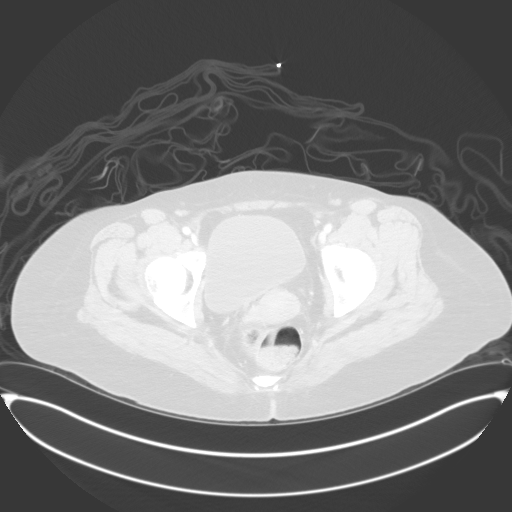
[im 19/93  bone]
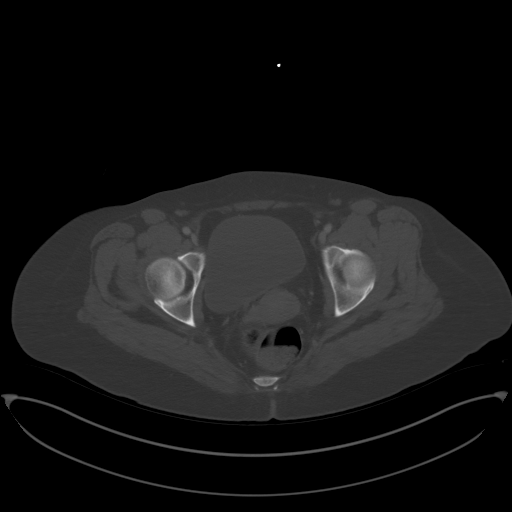
[im 37/93  soft-tissue]
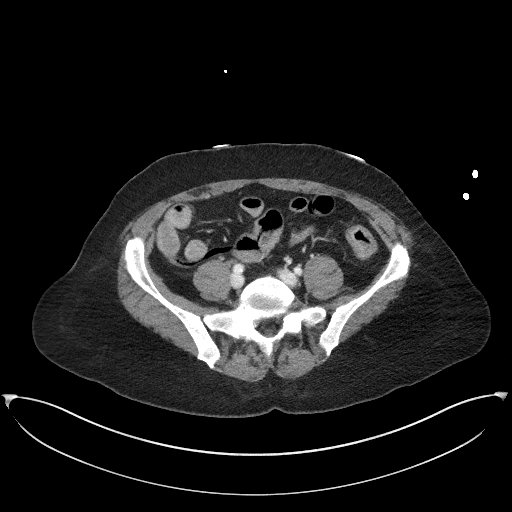
[im 37/93  lung]
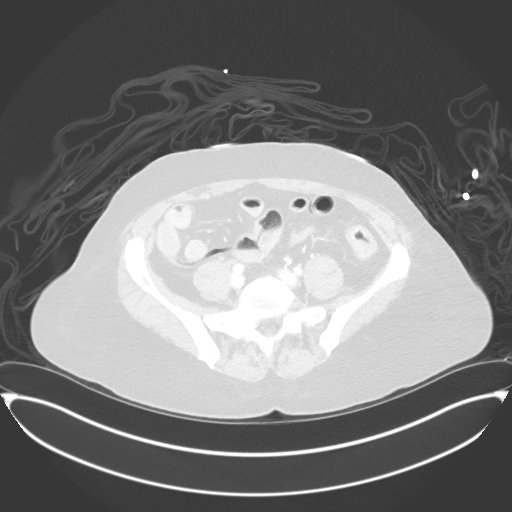
[im 56/93  soft-tissue]
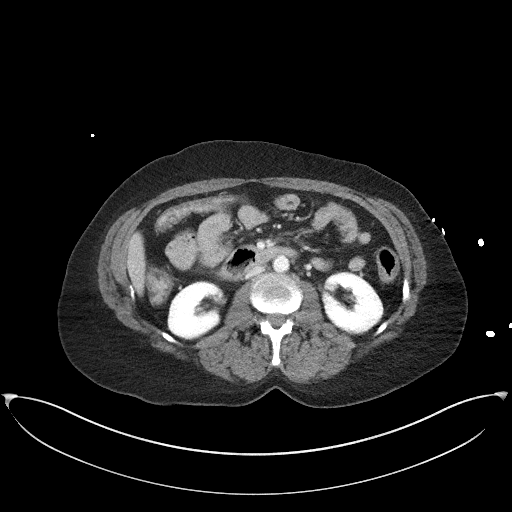
[im 56/93  lung]
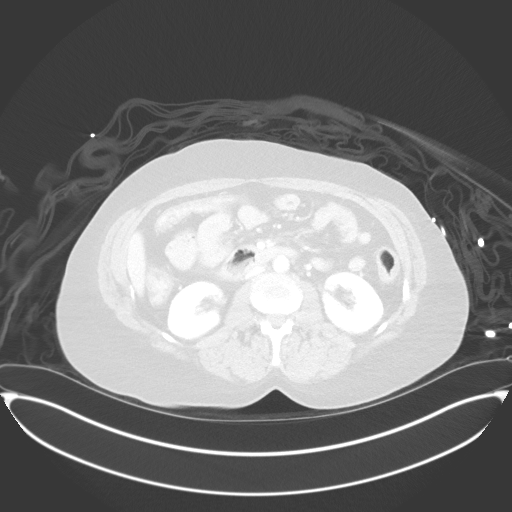
[im 74/93  soft-tissue]
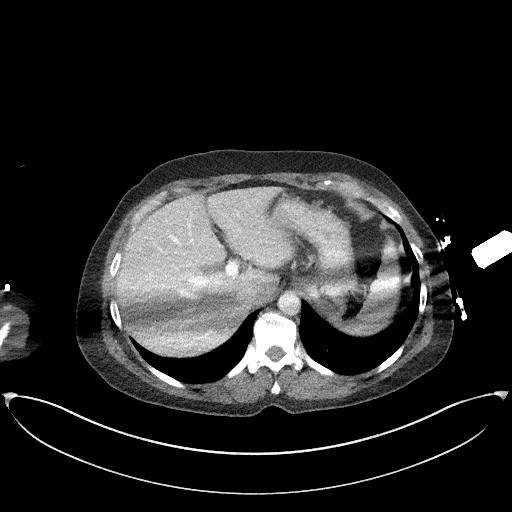
[im 74/93  lung]
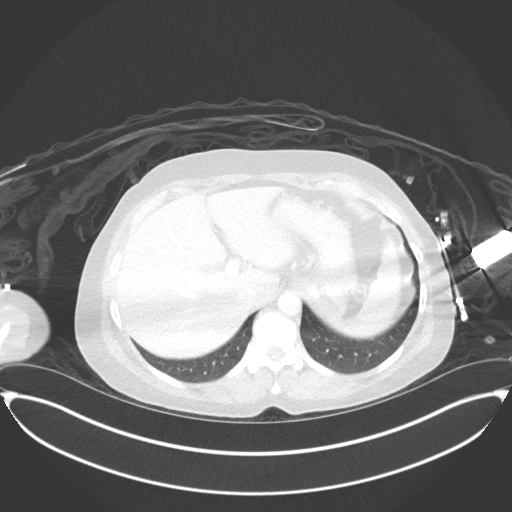

[Series 9: coronal mpr · coronal · 0.73mm/px · 2 of 110 slices shown, 3 images]
[im 37/110  soft-tissue]
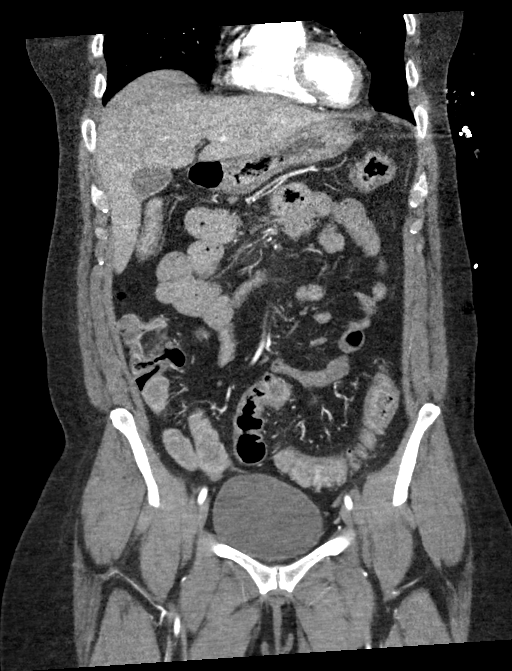
[im 37/110  bone]
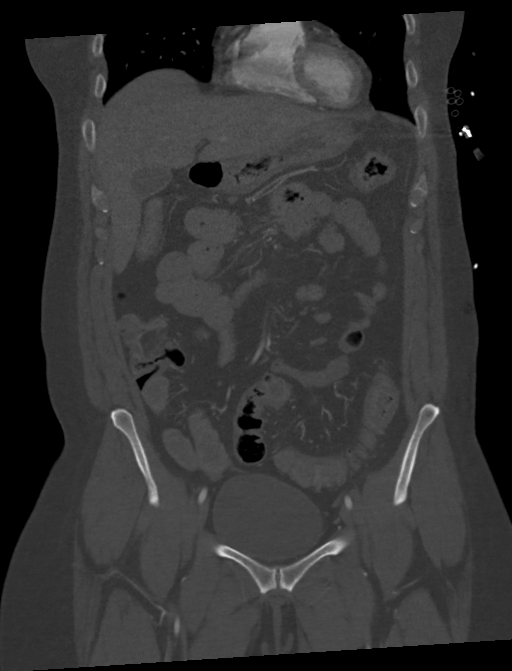
[im 73/110  soft-tissue]
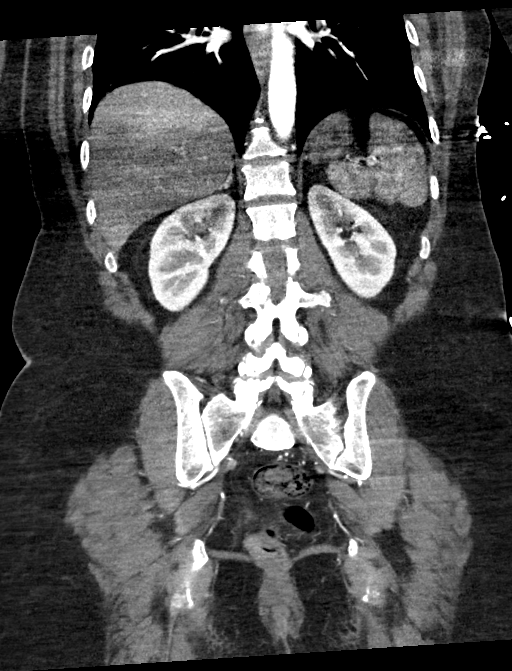

[11 of 46 positions shown; findings below may reference images not displayed]

FINDINGS: VASCULAR

Aorta: Normal caliber aorta without aneurysm, dissection, vasculitis
or significant stenosis.

Celiac: Patent without evidence of aneurysm, dissection, vasculitis
or significant stenosis.

SMA: Patent without evidence of aneurysm, dissection, vasculitis or
significant stenosis.

Renals: Both renal arteries are patent without evidence of aneurysm,
dissection, vasculitis, fibromuscular dysplasia or significant
stenosis.

IMA: Patent without evidence of aneurysm, dissection, vasculitis or
significant stenosis.

Inflow: Patent without evidence of aneurysm, dissection, vasculitis
or significant stenosis.

Proximal Outflow: Bilateral common femoral and visualized portions
of the superficial and profunda femoral arteries are patent without
evidence of aneurysm, dissection, vasculitis or significant
stenosis.

Veins: No obvious venous abnormality within the limitations of this
arterial phase study.

Review of the MIP images confirms the above findings.

NON-VASCULAR

Lower chest: No acute pleural or parenchymal lung disease.

Hepatobiliary: No focal liver abnormality is seen. No gallstones,
gallbladder wall thickening, or biliary dilatation.

Pancreas: Unremarkable. No pancreatic ductal dilatation or
surrounding inflammatory changes.

Spleen: Normal in size without focal abnormality.

Adrenals/Urinary Tract: Adrenal glands are unremarkable. Kidneys are
normal, without renal calculi, focal lesion, or hydronephrosis.
Bladder is unremarkable.

Stomach/Bowel: No bowel obstruction or ileus. There is diffuse
colonic wall thickening, consistent with inflammatory or infectious
colitis. The appendix is normal.

There is no intraluminal accumulation of contrast to suggest active
gastrointestinal bleeding.

Lymphatic: No pathologic adenopathy.

Reproductive: Uterus and bilateral adnexa are unremarkable.

Other: No free fluid or free gas.  No abdominal wall hernia.

Musculoskeletal: No acute or destructive bony lesions. Reconstructed
images demonstrate no additional findings.
IMPRESSION: VASCULAR

1. No evidence of active gastrointestinal bleeding.

NON-VASCULAR

1. Diffuse colonic wall thickening compatible with inflammatory or
infectious colitis.

## 2022-07-31 ENCOUNTER — Ambulatory Visit: Payer: Self-pay | Admitting: *Deleted

## 2022-07-31 NOTE — Telephone Encounter (Signed)
Reason for Disposition  [1] Numbness (i.e., loss of sensation) of the face, arm / hand, or leg / foot on one side of the body AND [2] gradual onset (e.g., days to weeks) AND [3] present now  Answer Assessment - Initial Assessment Questions 1. SYMPTOM: "What is the main symptom you are concerned about?" (e.g., weakness, numbness)     Numbness in hand- left 2. ONSET: "When did this start?" (minutes, hours, days; while sleeping)     3 weeks ago 3. LAST NORMAL: "When was the last time you (the patient) were normal (no symptoms)?"     Just when wakes in am- goes away 1 hours 4. PATTERN "Does this come and go, or has it been constant since it started?"  "Is it present now?"     Comes and goes 5. CARDIAC SYMPTOMS: "Have you had any of the following symptoms: chest pain, difficulty breathing, palpitations?"     no 6. NEUROLOGIC SYMPTOMS: "Have you had any of the following symptoms: headache, dizziness, vision loss, double vision, changes in speech, unsteady on your feet?"     Headaches- almost daily 7. OTHER SYMPTOMS: "Do you have any other symptoms?"     no  Protocols used: Neurologic Deficit-A-AH

## 2022-07-31 NOTE — Telephone Encounter (Signed)
  Chief Complaint: left hand numbness- in am Symptoms: wakes with hand/finger numbness- goes away after 1 hour, headache Frequency: at least 3 weeks Pertinent Negatives: Patient denies dizziness, vision loss, double vision, changes in speech, unsteady on your feet Disposition: [] ED /[x] Urgent Care (no appt availability in office) / [] Appointment(In office/virtual)/ []  [facility]/ [] Home Care/ [] Refused Recommended Disposition /[] [facility]/ []  Follow-up with PCP Additional Notes: Patient advised UC per protocol- no PCP at this time

## 2023-01-06 ENCOUNTER — Ambulatory Visit: Payer: Medicaid Other | Admitting: Internal Medicine

## 2023-01-06 ENCOUNTER — Encounter: Payer: Self-pay | Admitting: Internal Medicine

## 2023-01-06 VITALS — BP 134/82 | HR 77 | Temp 95.9°F | Ht 60.0 in | Wt 144.0 lb

## 2023-01-06 DIAGNOSIS — Z124 Encounter for screening for malignant neoplasm of cervix: Secondary | ICD-10-CM | POA: Diagnosis not present

## 2023-01-06 DIAGNOSIS — Z114 Encounter for screening for human immunodeficiency virus [HIV]: Secondary | ICD-10-CM

## 2023-01-06 DIAGNOSIS — R519 Headache, unspecified: Secondary | ICD-10-CM

## 2023-01-06 DIAGNOSIS — G8929 Other chronic pain: Secondary | ICD-10-CM

## 2023-01-06 DIAGNOSIS — E663 Overweight: Secondary | ICD-10-CM

## 2023-01-06 DIAGNOSIS — Z1159 Encounter for screening for other viral diseases: Secondary | ICD-10-CM | POA: Diagnosis not present

## 2023-01-06 DIAGNOSIS — Z8711 Personal history of peptic ulcer disease: Secondary | ICD-10-CM

## 2023-01-06 DIAGNOSIS — Z1231 Encounter for screening mammogram for malignant neoplasm of breast: Secondary | ICD-10-CM | POA: Diagnosis not present

## 2023-01-06 DIAGNOSIS — Z1211 Encounter for screening for malignant neoplasm of colon: Secondary | ICD-10-CM | POA: Diagnosis not present

## 2023-01-06 DIAGNOSIS — R7309 Other abnormal glucose: Secondary | ICD-10-CM | POA: Diagnosis not present

## 2023-01-06 DIAGNOSIS — Z0001 Encounter for general adult medical examination with abnormal findings: Secondary | ICD-10-CM | POA: Diagnosis not present

## 2023-01-06 DIAGNOSIS — E785 Hyperlipidemia, unspecified: Secondary | ICD-10-CM | POA: Diagnosis not present

## 2023-01-06 DIAGNOSIS — Z6828 Body mass index (BMI) 28.0-28.9, adult: Secondary | ICD-10-CM | POA: Diagnosis not present

## 2023-01-06 DIAGNOSIS — K219 Gastro-esophageal reflux disease without esophagitis: Secondary | ICD-10-CM | POA: Diagnosis not present

## 2023-01-06 DIAGNOSIS — Z9189 Other specified personal risk factors, not elsewhere classified: Secondary | ICD-10-CM | POA: Diagnosis not present

## 2023-01-06 NOTE — Assessment & Plan Note (Signed)
Resolved

## 2023-01-06 NOTE — Addendum Note (Signed)
Addended by: Lorre Munroe on: 01/06/2023 10:41 AM   Modules accepted: Level of Service

## 2023-01-06 NOTE — Assessment & Plan Note (Signed)
Avoid NSAIDs Continue pantoprazole

## 2023-01-06 NOTE — Progress Notes (Signed)
HPI  Patient presents to clinic today to establish care and for management of the conditions listed below.  She would like her annual exam today as well.  Frequent Headaches: Thesis occur rarely. Triggered by medication overuse with NSAID's. She takes as needed with some relief of symptoms. She does not follow with neurology.   Anemia: Her last H/H was 8.8/26.3, 2021.  She is not taking any oral iron at this time.  She does not follow with hematology.  GERD with History of PUD: Triggered by NSAID's.  She denies breakthrough on Pantoprazole.  Upper GI from 03/2020 reviewed.  History of Hep C: She has never had any treatment for this. History of IV drug use.  Flu: never Tetanus: > 10 years ago COVID: x 3 Shingrix: never Pap smear: > 5 years ago Mammogram: never Colon screening: 09/2012 Vision screening: annually Dentist: dentures  Diet: She does eat meat. She consumes fruits and veggies. She does eat fried foods. She drinks mostly Mt. Dew. Exercise: None  Past Medical History:  Diagnosis Date   Anemia    History of anemia and menorrhagia, menorrhagia treated with NovaSure   Hepatitis C     Current Outpatient Medications  Medication Sig Dispense Refill   pantoprazole (PROTONIX) 40 MG tablet Take 1 tablet (40 mg total) by mouth 2 (two) times daily. 60 tablet 5   No current facility-administered medications for this visit.    Allergies  Allergen Reactions   Amoxicillin Shortness Of Breath   Penicillins Nausea And Vomiting    Did it involve swelling of the face/tongue/throat, SOB, or low BP? No Did it involve sudden or severe rash/hives, skin peeling, or any reaction on the inside of your mouth or nose? No Did you need to seek medical attention at a hospital or doctor's office? No When did it last happen?       If all above answers are "NO", may proceed with cephalosporin use.    Codeine Rash    No family history on file.  Social History   Socioeconomic History    Marital status: Divorced    Spouse name: Not on file   Number of children: Not on file   Years of education: Not on file   Highest education level: Not on file  Occupational History   Not on file  Tobacco Use   Smoking status: Every Day    Packs/day: 1    Types: Cigarettes   Smokeless tobacco: Never  Substance and Sexual Activity   Alcohol use: No   Drug use: No   Sexual activity: Not on file  Other Topics Concern   Not on file  Social History Narrative   Not on file   Social Determinants of Health   Financial Resource Strain: Not on file  Food Insecurity: Not on file  Transportation Needs: Not on file  Physical Activity: Not on file  Stress: Not on file  Social Connections: Not on file  Intimate Partner Violence: Not on file    ROS:  Constitutional: Denies fever, malaise, fatigue, headache or abrupt weight changes.  HEENT: Denies eye pain, eye redness, ear pain, ringing in the ears, wax buildup, runny nose, nasal congestion, bloody nose, or sore throat. Respiratory: Denies difficulty breathing, shortness of breath, cough or sputum production.   Cardiovascular: Denies chest pain, chest tightness, palpitations or swelling in the hands or feet.  Gastrointestinal: Pt reports intermittent constipation. Denies abdominal pain, bloating, diarrhea or blood in the stool.  GU: Denies frequency, urgency,  pain with urination, blood in urine, odor or discharge. Musculoskeletal: Denies decrease in range of motion, difficulty with gait, muscle pain or joint pain and swelling.  Skin: Denies redness, rashes, lesions or ulcercations.  Neurological: Denies dizziness, difficulty with memory, difficulty with speech or problems with balance and coordination.  Psych: Denies anxiety, depression, SI/HI.  No other specific complaints in a complete review of systems (except as listed in HPI above).  PE:  BP 134/82 (BP Location: Right Arm, Patient Position: Sitting, Cuff Size: Normal)   Pulse  77   Temp (!) 95.9 F (35.5 C) (Temporal)   Ht 5' (1.524 m)   Wt 144 lb (65.3 kg)   SpO2 97%   BMI 28.12 kg/m   Wt Readings from Last 3 Encounters:  04/15/20 144 lb 11.2 oz (65.6 kg)  06/25/18 138 lb (62.6 kg)  04/01/17 148 lb (67.1 kg)    General: Appears her stated age, overweight, in NAD. HEENT: Head: normal shape and size; Eyes: sclera white, no icterus, conjunctiva pink, PERRLA and EOMs intact;  Neck: Neck supple, trachea midline. No masses, lumps or thyromegaly present.  Cardiovascular: Normal rate and rhythm. S1,S2 noted.  No murmur, rubs or gallops noted. No JVD or BLE edema. No carotid bruits noted. Pulmonary/Chest: Normal effort and positive vesicular breath sounds. No respiratory distress. No wheezes, rales or ronchi noted.  Abdomen: Normal bowel sounds Musculoskeletal: Strength 5/5 BUE/BLE. No difficulty with gait.  Neurological: Alert and oriented. Cranial nerves II-XII grossly intact. Coordination normal.  Psychiatric: Mood and affect normal. Behavior is normal. Judgment and thought content normal.    BMET    Component Value Date/Time   NA 142 04/15/2020 0401   NA 139 02/18/2014 0752   K 3.3 (L) 04/15/2020 0401   K 3.5 02/18/2014 0752   CL 107 04/15/2020 0401   CL 108 (H) 02/18/2014 0752   CO2 27 04/15/2020 0401   CO2 21 02/18/2014 0752   GLUCOSE 97 04/15/2020 0401   GLUCOSE 79 02/18/2014 0752   BUN <5 (L) 04/15/2020 0401   BUN 11 02/18/2014 0752   CREATININE 0.82 04/15/2020 0401   CREATININE 0.68 02/18/2014 0752   CREATININE 0.68 05/29/2011 1130   CALCIUM 8.2 (L) 04/15/2020 0401   CALCIUM 8.0 (L) 02/18/2014 0752   GFRNONAA >60 04/15/2020 0401   GFRNONAA >60 02/18/2014 0752   GFRAA >60 04/15/2020 0401   GFRAA >60 02/18/2014 0752    Lipid Panel  No results found for: "CHOL", "TRIG", "HDL", "CHOLHDL", "VLDL", "LDLCALC"  CBC    Component Value Date/Time   WBC 6.1 04/15/2020 0401   RBC 2.97 (L) 04/15/2020 0401   HGB 8.8 (L) 04/15/2020 0401   HGB  11.0 (L) 02/19/2014 0435   HCT 26.3 (L) 04/15/2020 0401   HCT 32.0 (L) 02/19/2014 0435   PLT 145 (L) 04/15/2020 0401   PLT 217 02/19/2014 0435   MCV 88.6 04/15/2020 0401   MCV 91 02/19/2014 0435   MCH 29.6 04/15/2020 0401   MCHC 33.5 04/15/2020 0401   RDW 14.4 04/15/2020 0401   RDW 14.2 02/19/2014 0435   LYMPHSABS 1.6 04/15/2020 0401   LYMPHSABS 2.3 02/19/2014 0435   MONOABS 0.4 04/15/2020 0401   MONOABS 0.5 02/19/2014 0435   EOSABS 0.1 04/15/2020 0401   EOSABS 0.1 02/19/2014 0435   BASOSABS 0.0 04/15/2020 0401   BASOSABS 0.0 02/19/2014 0435    Hgb A1C No results found for: "HGBA1C"   Assessment and Plan:  Preventative Health Maintenance:  Encouraged her to get  a flu shot in the fall Tetanus declined Encouraged her to get her COVID-vaccine Discussed Shingrix vaccine, she will check coverage with her insurance company and schedule visit she would like to have this done Referral to GYN for Pap smear Mammogram ordered-she will call to schedule Referral to GI for screening colonoscopy Encouraged her to consume a balanced diet and exercise regimen As her to see an eye doctor and dentist annually We will check CBC, c-Met, lipid, A1c, HIV and hep C today  RTC in 1 year for your annual exam Nicki Reaper, NP

## 2023-01-06 NOTE — Assessment & Plan Note (Signed)
Continue pantoprazole. °

## 2023-01-06 NOTE — Patient Instructions (Signed)

## 2023-01-06 NOTE — Assessment & Plan Note (Signed)
Encourage diet and exercise for weight loss 

## 2023-01-09 LAB — CBC
HCT: 39.7 % (ref 35.0–45.0)
Hemoglobin: 12.9 g/dL (ref 11.7–15.5)
MCH: 30.2 pg (ref 27.0–33.0)
MCHC: 32.5 g/dL (ref 32.0–36.0)
MCV: 93 fL (ref 80.0–100.0)
MPV: 12.1 fL (ref 7.5–12.5)
Platelets: 237 10*3/uL (ref 140–400)
RBC: 4.27 10*6/uL (ref 3.80–5.10)
RDW: 12.6 % (ref 11.0–15.0)
WBC: 6.5 10*3/uL (ref 3.8–10.8)

## 2023-01-09 LAB — LIPID PANEL
Cholesterol: 165 mg/dL (ref <200)
HDL: 55 mg/dL (ref >=50)
LDL Cholesterol (Calc): 89 mg/dL (calc)
Non-HDL Cholesterol (Calc): 110 mg/dL (calc) (ref <130)
Total CHOL/HDL Ratio: 3 (calc) (ref <5.0)
Triglycerides: 114 mg/dL (ref <150)

## 2023-01-09 LAB — COMPLETE METABOLIC PANEL WITH GFR
AG Ratio: 2 (calc) (ref 1.0–2.5)
ALT: 13 U/L (ref 6–29)
AST: 16 U/L (ref 10–35)
Albumin: 4.5 g/dL (ref 3.6–5.1)
Alkaline phosphatase (APISO): 82 U/L (ref 37–153)
BUN: 12 mg/dL (ref 7–25)
CO2: 30 mmol/L (ref 20–32)
Calcium: 9.8 mg/dL (ref 8.6–10.4)
Chloride: 104 mmol/L (ref 98–110)
Creat: 0.74 mg/dL (ref 0.50–1.03)
Globulin: 2.3 g/dL (calc) (ref 1.9–3.7)
Glucose, Bld: 58 mg/dL — ABNORMAL LOW (ref 65–99)
Potassium: 3.9 mmol/L (ref 3.5–5.3)
Sodium: 142 mmol/L (ref 135–146)
Total Bilirubin: 0.2 mg/dL (ref 0.2–1.2)
Total Protein: 6.8 g/dL (ref 6.1–8.1)
eGFR: 97 mL/min/{1.73_m2} (ref >=60)

## 2023-01-09 LAB — HEPATITIS C RNA QUANTITATIVE
HCV Quantitative Log: <1.18 log IU/mL
HCV RNA, PCR, QN: <15 IU/mL

## 2023-01-09 LAB — HEMOGLOBIN A1C
Hgb A1c MFr Bld: 5.6 % of total Hgb (ref <5.7)
Mean Plasma Glucose: 114 mg/dL
eAG (mmol/L): 6.3 mmol/L

## 2023-01-09 LAB — HIV ANTIBODY (ROUTINE TESTING W REFLEX): HIV 1&2 Ab, 4th Generation: NONREACTIVE

## 2023-01-09 LAB — HEPATITIS C ANTIBODY: Hepatitis C Ab: REACTIVE — AB

## 2023-01-15 ENCOUNTER — Encounter: Payer: Self-pay | Admitting: *Deleted

## 2023-08-03 ENCOUNTER — Ambulatory Visit: Payer: Self-pay

## 2023-08-03 NOTE — Telephone Encounter (Addendum)
  Chief Complaint:  2 knot under left breast pain Symptoms: knots is tender to touch and painful at times Frequency: for a while could not give exact time Pertinent Negatives: Patient denies SOB, chest pain that radiates to left arm, neck, shoulder, back, sweating, nausea, dizziness Disposition: [] ED /[] Urgent Care (no appt availability in office) / [x] Appointment(In office/virtual)/ []  [facility]/ [] Home Care/ [] Refused Recommended Disposition /[] [facility]/ []  Follow-up with PCP Additional Notes: discussed pt with Claudia Cantu who agrees that pt can have appointment- pt stated she can feel a knot as well has husband.  Answer Assessment - Initial Assessment Questions 1. LOCATION: Where does it hurt?       Left under breast feels a knot  2. RADIATION: Does the pain go anywhere else? (e.g., into neck, jaw, arms, back)     *No Answer* 3. ONSET: When did the chest pain begin? (Minutes, hours or days)      *No Answer* 4. PATTERN: Does the pain come and go, or has it been constant since it started?  Does it get worse with exertion?      *No Answer* 5. DURATION: How long does it last (e.g., seconds, minutes, hours)     *No Answer* 6. SEVERITY: How bad is the pain?  (e.g., Scale 1-10; mild, moderate, or severe)    - MILD (1-3): doesn't interfere with normal activities     - MODERATE (4-7): interferes with normal activities or awakens from sleep    - SEVERE (8-10): excruciating pain, unable to do any normal activities       *No Answer* 7. CARDIAC RISK FACTORS: Do you have any history of heart problems or risk factors for heart disease? (e.g., angina, prior heart attack; diabetes, high blood pressure, high cholesterol, smoker, or strong family history of heart disease)     *No Answer* 8. PULMONARY RISK FACTORS: Do you have any history of lung disease?  (e.g., blood clots in lung, asthma, emphysema, birth control pills)     *No Answer* 9. CAUSE: What do you  think is causing the chest pain?     *No Answer* 10. OTHER SYMPTOMS: Do you have any other symptoms? (e.g., dizziness, nausea, vomiting, sweating, fever, difficulty breathing, cough)       *No Answer* 11. PREGNANCY: Is there any chance you are pregnant? When was your last menstrual period?       *No Answer*  Protocols used: Chest Pain-A-AH

## 2023-08-03 NOTE — Telephone Encounter (Signed)
 Reason for Disposition . [1] Chest pain lasts < 5 minutes AND [2] NO chest pain or cardiac symptoms (e.g., breathing difficulty, sweating) now  (Exception: Chest pains that last only a few seconds.)  Additional Information . Commented on: Answer Assessment    1. Location: knots under left breast 2. No radiation 3. Could not say except for a while 4. Comes and goes 6 Occasionally wakes her up at night 7. Smoker 8.no h/o blood clots 9. 2 knots on ribs under left breast 10. Denies, radiating pain, cough, sweating, fever, difficulty  breathing, cough. Nausea,vomiting, 11. no  Protocols used: Chest Pain-A-AH

## 2023-08-04 ENCOUNTER — Ambulatory Visit: Payer: Medicaid Other | Admitting: Internal Medicine

## 2023-08-06 ENCOUNTER — Ambulatory Visit: Payer: Medicaid Other | Admitting: Internal Medicine

## 2023-08-06 NOTE — Progress Notes (Deleted)
 Subjective:    Patient ID: Claudia Cantu, female    DOB: 18-Mar-1971, 53 y.o.   MRN: [id number]  HPI    Review of Systems   Past Medical History:  Diagnosis Date   Anemia    History of anemia and menorrhagia, menorrhagia treated with NovaSure   Hepatitis C     Current Outpatient Medications  Medication Sig Dispense Refill   pantoprazole  (PROTONIX ) 40 MG tablet Take 1 tablet (40 mg total) by mouth 2 (two) times daily. (Patient taking differently: Take 40 mg by mouth daily.) 60 tablet 5   No current facility-administered medications for this visit.    Allergies  Allergen Reactions   Amoxicillin Shortness Of Breath   Penicillins Nausea And Vomiting    Did it involve swelling of the face/tongue/throat, SOB, or low BP? No Did it involve sudden or severe rash/hives, skin peeling, or any reaction on the inside of your mouth or nose? No Did you need to seek medical attention at a hospital or doctor's office? No When did it last happen?       If all above answers are "NO", may proceed with cephalosporin use.    Codeine Rash    Family History  Problem Relation Age of Onset   Aneurysm Mother    Hypertension Mother    COPD Mother    COPD Father    Ulcers Father    Healthy Sister    Healthy Sister    COPD Maternal Grandmother    Colon cancer Maternal Uncle    Breast cancer Neg Hx     Social History   Socioeconomic History   Marital status: Divorced    Spouse name: Not on file   Number of children: Not on file   Years of education: Not on file   Highest education level: Not on file  Occupational History   Not on file  Tobacco Use   Smoking status: Every Day    Current packs/day: 1.00    Types: Cigarettes   Smokeless tobacco: Never  Substance and Sexual Activity   Alcohol use: No   Drug use: No   Sexual activity: Not on file  Other Topics Concern   Not on file  Social History Narrative   Not on file   Social Drivers of Health   Financial Resource  Strain: Not on file  Food Insecurity: Not on file  Transportation Needs: Not on file  Physical Activity: Not on file  Stress: Not on file  Social Connections: Not on file  Intimate Partner Violence: Not on file     Constitutional: Denies fever, malaise, fatigue, headache or abrupt weight changes.  HEENT: Denies eye pain, eye redness, ear pain, ringing in the ears, wax buildup, runny nose, nasal congestion, bloody nose, or sore throat. Respiratory: Denies difficulty breathing, shortness of breath, cough or sputum production.   Cardiovascular: Denies chest pain, chest tightness, palpitations or swelling in the hands or feet.  Gastrointestinal: Denies abdominal pain, bloating, constipation, diarrhea or blood in the stool.  GU: Denies urgency, frequency, pain with urination, burning sensation, blood in urine, odor or discharge. Musculoskeletal: Denies decrease in range of motion, difficulty with gait, muscle pain or joint pain and swelling.  Skin: Denies redness, rashes, lesions or ulcercations.  Neurological: Denies dizziness, difficulty with memory, difficulty with speech or problems with balance and coordination.  Psych: Denies anxiety, depression, SI/HI.  No other specific complaints in a complete review of systems (except as listed in HPI above).  Objective:   Physical Exam  There were no vitals taken for this visit. Wt Readings from Last 3 Encounters:  01/06/23 144 lb (65.3 kg)  04/15/20 144 lb 11.2 oz (65.6 kg)  06/25/18 138 lb (62.6 kg)    General: Appears their stated age, well developed, well nourished in NAD. Skin: Warm, dry and intact. No rashes, lesions or ulcerations noted. HEENT: Head: normal shape and size; Eyes: sclera white, no icterus, conjunctiva pink, PERRLA and EOMs intact; Ears: Tm's gray and intact, normal light reflex; Nose: mucosa pink and moist, septum midline; Throat/Mouth: Teeth present, mucosa pink and moist, no exudate, lesions or ulcerations noted.   Neck:  Neck supple, trachea midline. No masses, lumps or thyromegaly present.  Cardiovascular: Normal rate and rhythm. S1,S2 noted.  No murmur, rubs or gallops noted. No JVD or BLE edema. No carotid bruits noted. Pulmonary/Chest: Normal effort and positive vesicular breath sounds. No respiratory distress. No wheezes, rales or ronchi noted.  Abdomen: Soft and nontender. Normal bowel sounds. No distention or masses noted. Liver, spleen and kidneys non palpable. Musculoskeletal: Normal range of motion. No signs of joint swelling. No difficulty with gait.  Neurological: Alert and oriented. Cranial nerves II-XII grossly intact. Coordination normal.  Psychiatric: Mood and affect normal. Behavior is normal. Judgment and thought content normal.    BMET    Component Value Date/Time   NA 142 01/06/2023 1032   NA 139 02/18/2014 0752   K 3.9 01/06/2023 1032   K 3.5 02/18/2014 0752   CL 104 01/06/2023 1032   CL 108 (H) 02/18/2014 0752   CO2 30 01/06/2023 1032   CO2 21 02/18/2014 0752   GLUCOSE 58 (L) 01/06/2023 1032   GLUCOSE 79 02/18/2014 0752   BUN 12 01/06/2023 1032   BUN 11 02/18/2014 0752   CREATININE 0.74 01/06/2023 1032   CALCIUM 9.8 01/06/2023 1032   CALCIUM 8.0 (L) 02/18/2014 0752   GFRNONAA >60 04/15/2020 0401   GFRNONAA >60 02/18/2014 0752   GFRAA >60 04/15/2020 0401   GFRAA >60 02/18/2014 0752    Lipid Panel     Component Value Date/Time   CHOL 165 01/06/2023 1032   TRIG 114 01/06/2023 1032   HDL 55 01/06/2023 1032   CHOLHDL 3.0 01/06/2023 1032   LDLCALC 89 01/06/2023 1032    CBC    Component Value Date/Time   WBC 6.5 01/06/2023 1032   RBC 4.27 01/06/2023 1032   HGB 12.9 01/06/2023 1032   HGB 11.0 (L) 02/19/2014 0435   HCT 39.7 01/06/2023 1032   HCT 32.0 (L) 02/19/2014 0435   PLT 237 01/06/2023 1032   PLT 217 02/19/2014 0435   MCV 93.0 01/06/2023 1032   MCV 91 02/19/2014 0435   MCH 30.2 01/06/2023 1032   MCHC 32.5 01/06/2023 1032   RDW 12.6 01/06/2023 1032    RDW 14.2 02/19/2014 0435   LYMPHSABS 1.6 04/15/2020 0401   LYMPHSABS 2.3 02/19/2014 0435   MONOABS 0.4 04/15/2020 0401   MONOABS 0.5 02/19/2014 0435   EOSABS 0.1 04/15/2020 0401   EOSABS 0.1 02/19/2014 0435   BASOSABS 0.0 04/15/2020 0401   BASOSABS 0.0 02/19/2014 0435    Hgb A1C Lab Results  Component Value Date   HGBA1C 5.6 01/06/2023            Assessment & Plan:   RTC in 5 months for your annual exam Angeline Laura, NP

## 2023-09-13 ENCOUNTER — Encounter: Payer: Self-pay | Admitting: Emergency Medicine

## 2023-09-13 ENCOUNTER — Ambulatory Visit
Admission: EM | Admit: 2023-09-13 | Discharge: 2023-09-13 | Disposition: A | Discharge status: Home / Self Care | Payer: Medicaid Other | Attending: Emergency Medicine | Admitting: Emergency Medicine

## 2023-09-13 ENCOUNTER — Ambulatory Visit (INDEPENDENT_AMBULATORY_CARE_PROVIDER_SITE_OTHER): Payer: Medicaid Other

## 2023-09-13 DIAGNOSIS — R0781 Pleurodynia: Secondary | ICD-10-CM

## 2023-09-13 DIAGNOSIS — R222 Localized swelling, mass and lump, trunk: Secondary | ICD-10-CM

## 2023-09-13 MED ORDER — METHYLPREDNISOLONE 4 MG PO TBPK: ORAL_TABLET | Freq: Every day | ORAL | 0 refills | Status: DC

## 2023-09-13 MED ORDER — METHYLPREDNISOLONE 4 MG PO TBPK: ORAL_TABLET | Freq: Every day | ORAL | 0 refills | Status: AC

## 2023-09-13 NOTE — ED Provider Notes (Signed)
HPI  SUBJECTIVE:  Claudia Cantu is a 53 y.o. female who presents with a painful mass underneath her left breast starting over 2 months ago.  She states that it got worse over the past week.  She describes the pain as constant, stabbing.  No recent heavy coughing fits.  No recent trauma to the area, erythema, change in the size of the swelling, fevers, change in her baseline coughing, wheezing, shortness of breath.  She has a remote history of multiple rib fractures, but cannot remember if she ever sustained a rib fracture in this area.  She tried massage with some improvement in her symptoms.  Symptoms worse with lifting, torso movement, and after gaining weight.  She has a past medical history of hepatitis C that was treated with no residual liver damage, peptic ulcer disease.  She is unable to take NSAIDs. she is a smoker.  PCP: Lovie Macadamia   Past Medical History:  Diagnosis Date   Anemia    History of anemia and menorrhagia, menorrhagia treated with NovaSure   Hepatitis C     Past Surgical History:  Procedure Laterality Date   CESAREAN SECTION     ESOPHAGOGASTRODUODENOSCOPY (EGD) WITH PROPOFOL N/A 04/14/2020   Procedure: ESOPHAGOGASTRODUODENOSCOPY (EGD) WITH PROPOFOL;  Surgeon: Midge Minium, MD;  Location: [facility] ENDOSCOPY;  Service: Endoscopy;  Laterality: N/A;   NOVASURE ABLATION     TUBAL LIGATION      Family History  Problem Relation Age of Onset   Aneurysm Mother    Hypertension Mother    COPD Mother    COPD Father    Ulcers Father    Healthy Sister    Healthy Sister    COPD Maternal Grandmother    Colon cancer Maternal Uncle    Breast cancer Neg Hx     Social History   Tobacco Use   Smoking status: Every Day    Current packs/day: 1.00    Types: Cigarettes   Smokeless tobacco: Never  Vaping Use   Vaping status: Never Used  Substance Use Topics   Alcohol use: No   Drug use: No    No current facility-administered medications for this  encounter.  Current Outpatient Medications:    methylPREDNISolone (MEDROL DOSEPAK) 4 MG TBPK tablet, Take by mouth daily. Follow package instructions, Disp: 21 tablet, Rfl: 0   pantoprazole (PROTONIX) 40 MG tablet, Take 1 tablet (40 mg total) by mouth 2 (two) times daily. (Patient taking differently: Take 40 mg by mouth daily.), Disp: 60 tablet, Rfl: 5  Allergies  Allergen Reactions   Amoxicillin Shortness Of Breath   Penicillins Nausea And Vomiting    Did it involve swelling of the face/tongue/throat, SOB, or low BP? No Did it involve sudden or severe rash/hives, skin peeling, or any reaction on the inside of your mouth or nose? No Did you need to seek medical attention at a hospital or doctor's office? No When did it last happen?       If all above answers are "NO", may proceed with cephalosporin use.    Codeine Rash     ROS  As noted in HPI.   Physical Exam  BP (!) (P) 154/96 (BP Location: Left Arm)   Pulse (P) 66   Temp (P) 98.3 F (36.8 C) (Oral)   Resp (P) 14   Ht 5' (1.524 m)   Wt 65.3 kg   SpO2 (P) 100%   BMI 28.12 kg/m   Constitutional: Well developed, well nourished, no  acute distress Eyes:  EOMI, conjunctiva normal bilaterally HENT: Normocephalic, atraumatic,mucus membranes moist Respiratory: Normal inspiratory effort, lungs clear bilaterally.  6 x 3 tender nonerythematous mass along the lower anterior left rib.  Cardiovascular: Normal rate GI: nondistended, soft, nontender. skin: No rash, skin intact Musculoskeletal: no deformities Neurologic: Alert & oriented x 3, no focal neuro deficits Psychiatric: Speech and behavior appropriate   ED Course   Medications - No data to display  Orders Placed This Encounter  Procedures   DG Ribs Unilateral W/Chest Left    Standing Status:   Standing    Number of Occurrences:   1    Reason for Exam (SYMPTOM  OR DIAGNOSIS REQUIRED):   Possible history of rib fracture.  6 x 3 cm tender area of swelling without any  evidence of infection on lower anterior rib.  Rule out any acute changes.    No results found for this or any previous visit (from the past 24 hours). DG Ribs Unilateral W/Chest Left Result Date: 09/13/2023 CLINICAL DATA:  Possible rib fracture, swelling. EXAM: LEFT RIBS AND CHEST - 3+ VIEW COMPARISON:  CT abdomen and pelvis dated 04/13/2020. FINDINGS: Deformity of the lateral left seventh rib may represent a nondisplaced fracture. There is no evidence of pneumothorax or pleural effusion. Both lungs are clear. Heart size and mediastinal contours are within normal limits. IMPRESSION: Deformity of the lateral left seventh rib may represent a nondisplaced fracture. Electronically Signed   By: Romona Curls M.D.   On: 09/13/2023 17:32    ED Clinical Impression  1. Rib pain on left side   2. Mass of torso     ED Assessment/Plan   Suspect lipoma versus calcification over an old fracture, however given her long history of smoking, malignancy in the differential, so we will get a left rib series.  She denies any recent history of trauma.    Reviewed independently.  No displaced fracture or acute changes. Will contact patient at [phone] if radiology over read differs enough from mine and we need to change management.  Reviewed radiology report.  Deformity of the seventh rib which could be a nondisplaced fracture.  See radiology report for details.  Chest x-ray with deformity of the seventh rib.  However, patient denies trauma to the chest, recent coughing.  I suspect it could be an old, healed fracture that is inflamed.  Called patient and discussed this finding with her.  Confirmed absence of trauma or recent coughing.  There is no change to this plan-home with Medrol Dosepak, Tylenol.  Confirmed the correct pharmacy that she wants it sent to.  The original pharmacy that she gave [place] is closed.  She has peptic ulcer disease and is unable to take NSAIDs.  Follow-up with her PCP as  needed.  Discussed imaging, MDM, treatment plan  and followup with patient , family.  They agree with plan.   Meds ordered this encounter  Medications   DISCONTD: methylPREDNISolone (MEDROL DOSEPAK) 4 MG TBPK tablet    Sig: Take by mouth daily. Follow package instructions    Dispense:  21 tablet    Refill:  0   methylPREDNISolone (MEDROL DOSEPAK) 4 MG TBPK tablet    Sig: Take by mouth daily. Follow package instructions    Dispense:  21 tablet    Refill:  0    *This clinic note was created using Dragon dictation software. Therefore, there may be occasional mistakes despite careful proofreading.  ?     Domenick Gong, MD  09/14/23 1200  

## 2023-09-13 NOTE — ED Triage Notes (Signed)
 Patient c/o abscess below her left breast for a week.

## 2023-09-13 NOTE — Discharge Instructions (Addendum)
 I did not appreciate any acute changes or any abnormal bone growth on your x-ray. We will contact you if radiology sees something that I did not.  In the meantime, 1000 mg of Tylenol 3 or 4 times a day as needed for pain.  The Medrol Dosepak is a steroid, should help with pain and swelling.  Follow-up with your primary care provider if you do not improve after finishing the steroids.  This could be a lipoma, which is a benign fatty tumor.  Go to the ER if you get worse.

## 2023-11-06 ENCOUNTER — Encounter: Payer: Self-pay | Admitting: Emergency Medicine

## 2023-11-06 ENCOUNTER — Ambulatory Visit
Admission: EM | Admit: 2023-11-06 | Discharge: 2023-11-06 | Disposition: A | Discharge status: Home / Self Care | Attending: Emergency Medicine | Admitting: Emergency Medicine

## 2023-11-06 ENCOUNTER — Ambulatory Visit

## 2023-11-06 DIAGNOSIS — F17200 Nicotine dependence, unspecified, uncomplicated: Secondary | ICD-10-CM | POA: Insufficient documentation

## 2023-11-06 DIAGNOSIS — R051 Acute cough: Secondary | ICD-10-CM

## 2023-11-06 DIAGNOSIS — R0781 Pleurodynia: Secondary | ICD-10-CM

## 2023-11-06 DIAGNOSIS — R059 Cough, unspecified: Secondary | ICD-10-CM | POA: Diagnosis not present

## 2023-11-06 MED ORDER — BENZONATATE 100 MG PO CAPS: 100.0000 mg | ORAL_CAPSULE | Freq: Three times a day (TID) | ORAL | 0 refills | Status: AC

## 2023-11-06 NOTE — Discharge Instructions (Signed)
 Your initial CXR is negative for pneumonia/fracture,check my chart for official radiology read. Take tessalon as directed Stop smoking Push fluids, follow up with PCP.

## 2023-11-06 NOTE — ED Triage Notes (Signed)
 Patient reports cough and chest congestion for a month.  Patient reports right sided rib pain that started 5 days ago.

## 2023-11-06 NOTE — ED Provider Notes (Signed)
 [facility]    CSN: [id number] Arrival date & time: 11/06/23  1231      History   Chief Complaint Chief Complaint  Patient presents with   Cough   Right Rib Pain    HPI FRANKI ALCAIDE is a 53 y.o. female.   53 year old female, Miyah Hampshire, presents to urgent care for evaluation of cough congestion for a month.  Patient reports right-sided rib pain that started 5 days ago. Pt denies any trauma or fall. Pt requesting tessalon perles for cough.   PMH: Pt smokes 1ppd, anemia, hep C  The history is provided by the patient. No language interpreter was used.    Past Medical History:  Diagnosis Date   Anemia    History of anemia and menorrhagia, menorrhagia treated with NovaSure   Hepatitis C     Patient Active Problem List   Diagnosis Date Noted   Acute cough 11/06/2023   Rib pain on right side 11/06/2023   GERD (gastroesophageal reflux disease) 01/06/2023   History of peptic ulcer disease 01/06/2023   Overweight with body mass index (BMI) of 28 to 28.9 in adult 01/06/2023   Smoker 05/29/2011    Past Surgical History:  Procedure Laterality Date   CESAREAN SECTION     ESOPHAGOGASTRODUODENOSCOPY (EGD) WITH PROPOFOL N/A 04/14/2020   Procedure: ESOPHAGOGASTRODUODENOSCOPY (EGD) WITH PROPOFOL;  Surgeon: Midge Minium, MD;  Location: [facility] ENDOSCOPY;  Service: Endoscopy;  Laterality: N/A;   NOVASURE ABLATION     TUBAL LIGATION      OB History   No obstetric history on file.      Home Medications    Prior to Admission medications   Medication Sig Start Date End Date Taking? Authorizing Provider  benzonatate (TESSALON) 100 MG capsule Take 1 capsule (100 mg total) by mouth every 8 (eight) hours. 11/06/23  Yes Khan Chura, Para March, NP  methylPREDNISolone (MEDROL DOSEPAK) 4 MG TBPK tablet Take by mouth daily. Follow package instructions 09/13/23   Domenick Gong, MD  pantoprazole (PROTONIX) 40 MG tablet Take 1 tablet (40 mg total) by mouth 2 (two) times  daily. Patient taking differently: Take 40 mg by mouth daily. 04/15/20   Lewie Chamber, MD    Family History Family History  Problem Relation Age of Onset   Aneurysm Mother    Hypertension Mother    COPD Mother    COPD Father    Ulcers Father    Healthy Sister    Healthy Sister    COPD Maternal Grandmother    Colon cancer Maternal Uncle    Breast cancer Neg Hx     Social History Social History   Tobacco Use   Smoking status: Every Day    Current packs/day: 1.00    Types: Cigarettes   Smokeless tobacco: Never  Vaping Use   Vaping status: Never Used  Substance Use Topics   Alcohol use: No   Drug use: No     Allergies   Amoxicillin, Penicillins, and Codeine   Review of Systems Review of Systems  Constitutional:  Negative for fever.  HENT:  Positive for congestion.   Respiratory:  Positive for cough. Negative for shortness of breath and wheezing.   Musculoskeletal:  Positive for myalgias.  All other systems reviewed and are negative.    Physical Exam Triage Vital Signs ED Triage Vitals  Encounter Vitals Group     BP 11/06/23 1247 121/81     Systolic BP Percentile --      Diastolic BP  Percentile --      Pulse Rate 11/06/23 1247 86     Resp 11/06/23 1247 14     Temp 11/06/23 1247 98 F (36.7 C)     Temp Source 11/06/23 1247 Oral     SpO2 11/06/23 1247 97 %     Weight 11/06/23 1246 143 lb 15.4 oz (65.3 kg)     Height 11/06/23 1246 5' (1.524 m)     Head Circumference --      Peak Flow --      Pain Score 11/06/23 1246 9     Pain Loc --      Pain Education --      Exclude from Growth Chart --    No data found.  Updated Vital Signs BP 121/81 (BP Location: Left Arm)   Pulse 86   Temp 98 F (36.7 C) (Oral)   Resp 14   Ht 5' (1.524 m)   Wt 143 lb 15.4 oz (65.3 kg)   SpO2 97%   BMI 28.12 kg/m   Visual Acuity Right Eye Distance:   Left Eye Distance:   Bilateral Distance:    Right Eye Near:   Left Eye Near:    Bilateral Near:     Physical  Exam Vitals and nursing note reviewed.  Constitutional:      General: She is not in acute distress.    Appearance: She is well-developed.  HENT:     Head: Normocephalic.     Right Ear: Tympanic membrane is retracted.     Left Ear: Tympanic membrane is retracted.     Nose: Congestion present.     Mouth/Throat:     Lips: Pink.     Mouth: Mucous membranes are moist.     Pharynx: Oropharynx is clear.  Eyes:     General: Lids are normal.     Conjunctiva/sclera: Conjunctivae normal.     Pupils: Pupils are equal, round, and reactive to light.  Neck:     Trachea: No tracheal deviation.  Cardiovascular:     Rate and Rhythm: Normal rate and regular rhythm.     Heart sounds: Normal heart sounds. No murmur heard. Pulmonary:     Effort: Pulmonary effort is normal.     Breath sounds: Normal breath sounds and air entry.  Abdominal:     General: Bowel sounds are normal.     Palpations: Abdomen is soft.     Tenderness: There is no abdominal tenderness.  Musculoskeletal:        General: Normal range of motion.     Cervical back: Normal range of motion.  Lymphadenopathy:     Cervical: No cervical adenopathy.  Skin:    General: Skin is warm and dry.     Findings: No rash.  Neurological:     General: No focal deficit present.     Mental Status: She is alert and oriented to person, place, and time.     GCS: GCS eye subscore is 4. GCS verbal subscore is 5. GCS motor subscore is 6.  Psychiatric:        Attention and Perception: Attention normal.        Mood and Affect: Mood normal.        Speech: Speech normal.        Behavior: Behavior normal. Behavior is cooperative.      UC Treatments / Results  Labs (all labs ordered are listed, but only abnormal results are displayed) Labs Reviewed - No data to display  EKG   Radiology No results found.  Procedures Procedures (including critical care time)  Medications Ordered in UC Medications - No data to display  Initial Impression  / Assessment and Plan / UC Course  I have reviewed the triage vital signs and the nursing notes.  Pertinent labs & imaging results that were available during my care of the patient were reviewed by me and considered in my medical decision making (see chart for details).  Clinical Course as of 11/06/23 1347  Fri Nov 06, 2023  1233 Cough x 30 days, CXR ordered [JD]  1319 Cxr is negative for pneumonia or rib fracture, wet read by this provider [JD]  1321 Discussed exam findings and plan of care with patient, will prescription Tessalon for cough, smoking cessation discussed , patient verbalized understanding to this provider. [JD]    Clinical Course User Index [JD] Demetrias Goodbar, Para March, NP   Discussed exam findings and plan of care with patient, discussed initial wet read findings with patient , will prescription Tessalon as requested , if radiologist reads x-ray and determines pneumonia or fractured rib will notify patient and treat accordingly , strict go to ER precautions given.   Patient verbalized understanding to this provider.  Ddx: Acute cough, bronchitis , pneumonia rib pain, smoker, viral illness, allergies Final Clinical Impressions(s) / UC Diagnoses   Final diagnoses:  Acute cough  Rib pain on right side  Smoker     Discharge Instructions      Your initial CXR is negative for pneumonia/fracture,check my chart for official radiology read. Take tessalon as directed Stop smoking Push fluids, follow up with PCP.      ED Prescriptions     Medication Sig Dispense Auth. Provider   benzonatate (TESSALON) 100 MG capsule Take 1 capsule (100 mg total) by mouth every 8 (eight) hours. 21 capsule Levaeh Vice, Para March, NP      PDMP not reviewed this encounter.   Clancy Gourd, NP 11/06/23 1347

## 2024-01-07 ENCOUNTER — Encounter: Payer: Self-pay | Admitting: Internal Medicine

## 2024-01-07 NOTE — Progress Notes (Deleted)
 HPI  Patient presents to clinic today for her annual exam.  She is also due for follow-up of chronic conditions.  GERD with History of PUD: Triggered by NSAID's.  She denies breakthrough on pantoprazole .  Upper GI from 03/2020 reviewed.  Flu: never Tetanus: > 10 years ago COVID: x 3 Shingrix: never Pap smear: > 5 years ago Mammogram: never Colon screening: 09/2012 Vision screening: annually Dentist: dentures  Diet: She does eat meat. She consumes fruits and veggies. She does eat fried foods. She drinks mostly Mt. Dew. Exercise: None  Past Medical History:  Diagnosis Date   Anemia    History of anemia and menorrhagia, menorrhagia treated with NovaSure   Hepatitis C     Current Outpatient Medications  Medication Sig Dispense Refill   benzonatate  (TESSALON ) 100 MG capsule Take 1 capsule (100 mg total) by mouth every 8 (eight) hours. 21 capsule 0   methylPREDNISolone  (MEDROL  DOSEPAK) 4 MG TBPK tablet Take by mouth daily. Follow package instructions 21 tablet 0   pantoprazole  (PROTONIX ) 40 MG tablet Take 1 tablet (40 mg total) by mouth 2 (two) times daily. (Patient taking differently: Take 40 mg by mouth daily.) 60 tablet 5   No current facility-administered medications for this visit.    Allergies  Allergen Reactions   Amoxicillin Shortness Of Breath   Penicillins Nausea And Vomiting    Did it involve swelling of the face/tongue/throat, SOB, or low BP? No Did it involve sudden or severe rash/hives, skin peeling, or any reaction on the inside of your mouth or nose? No Did you need to seek medical attention at a hospital or doctor's office? No When did it last happen?       If all above answers are "NO", may proceed with cephalosporin use.    Codeine Rash    Family History  Problem Relation Age of Onset   Aneurysm Mother    Hypertension Mother    COPD Mother    COPD Father    Ulcers Father    Healthy Sister    Healthy Sister    COPD Maternal Grandmother    Colon  cancer Maternal Uncle    Breast cancer Neg Hx     Social History   Socioeconomic History   Marital status: Divorced    Spouse name: Not on file   Number of children: Not on file   Years of education: Not on file   Highest education level: Not on file  Occupational History   Not on file  Tobacco Use   Smoking status: Every Day    Current packs/day: 1.00    Types: Cigarettes   Smokeless tobacco: Never  Vaping Use   Vaping status: Never Used  Substance and Sexual Activity   Alcohol use: No   Drug use: No   Sexual activity: Not on file  Other Topics Concern   Not on file  Social History Narrative   Not on file   Social Drivers of Health   Financial Resource Strain: Not on file  Food Insecurity: Not on file  Transportation Needs: Not on file  Physical Activity: Not on file  Stress: Not on file  Social Connections: Not on file  Intimate Partner Violence: Not on file    ROS:  Constitutional: Denies fever, malaise, fatigue, headache or abrupt weight changes.  HEENT: Denies eye pain, eye redness, ear pain, ringing in the ears, wax buildup, runny nose, nasal congestion, bloody nose, or sore throat. Respiratory: Denies difficulty breathing, shortness of breath,  cough or sputum production.   Cardiovascular: Denies chest pain, chest tightness, palpitations or swelling in the hands or feet.  Gastrointestinal: Pt reports intermittent constipation. Denies abdominal pain, bloating, diarrhea or blood in the stool.  GU: Denies frequency, urgency, pain with urination, blood in urine, odor or discharge. Musculoskeletal: Denies decrease in range of motion, difficulty with gait, muscle pain or joint pain and swelling.  Skin: Denies redness, rashes, lesions or ulcercations.  Neurological: Denies dizziness, difficulty with memory, difficulty with speech or problems with balance and coordination.  Psych: Denies anxiety, depression, SI/HI.  No other specific complaints in a complete review  of systems (except as listed in HPI above).  PE:  There were no vitals taken for this visit.  Wt Readings from Last 3 Encounters:  11/06/23 143 lb 15.4 oz (65.3 kg)  09/13/23 143 lb 15.4 oz (65.3 kg)  01/06/23 144 lb (65.3 kg)    General: Appears her stated age, overweight, in NAD. HEENT: Head: normal shape and size; Eyes: sclera white, no icterus, conjunctiva pink, PERRLA and EOMs intact;  Neck: Neck supple, trachea midline. No masses, lumps or thyromegaly present.  Cardiovascular: Normal rate and rhythm. S1,S2 noted.  No murmur, rubs or gallops noted. No JVD or BLE edema. No carotid bruits noted. Pulmonary/Chest: Normal effort and positive vesicular breath sounds. No respiratory distress. No wheezes, rales or ronchi noted.  Abdomen: Normal bowel sounds Musculoskeletal: Strength 5/5 BUE/BLE. No difficulty with gait.  Neurological: Alert and oriented. Cranial nerves II-XII grossly intact. Coordination normal.  Psychiatric: Mood and affect normal. Behavior is normal. Judgment and thought content normal.    BMET    Component Value Date/Time   NA 142 01/06/2023 1032   NA 139 02/18/2014 0752   K 3.9 01/06/2023 1032   K 3.5 02/18/2014 0752   CL 104 01/06/2023 1032   CL 108 (H) 02/18/2014 0752   CO2 30 01/06/2023 1032   CO2 21 02/18/2014 0752   GLUCOSE 58 (L) 01/06/2023 1032   GLUCOSE 79 02/18/2014 0752   BUN 12 01/06/2023 1032   BUN 11 02/18/2014 0752   CREATININE 0.74 01/06/2023 1032   CALCIUM 9.8 01/06/2023 1032   CALCIUM 8.0 (L) 02/18/2014 0752   GFRNONAA >60 04/15/2020 0401   GFRNONAA >60 02/18/2014 0752   GFRAA >60 04/15/2020 0401   GFRAA >60 02/18/2014 0752    Lipid Panel     Component Value Date/Time   CHOL 165 01/06/2023 1032   TRIG 114 01/06/2023 1032   HDL 55 01/06/2023 1032   CHOLHDL 3.0 01/06/2023 1032   LDLCALC 89 01/06/2023 1032    CBC    Component Value Date/Time   WBC 6.5 01/06/2023 1032   RBC 4.27 01/06/2023 1032   HGB 12.9 01/06/2023 1032    HGB 11.0 (L) 02/19/2014 0435   HCT 39.7 01/06/2023 1032   HCT 32.0 (L) 02/19/2014 0435   PLT 237 01/06/2023 1032   PLT 217 02/19/2014 0435   MCV 93.0 01/06/2023 1032   MCV 91 02/19/2014 0435   MCH 30.2 01/06/2023 1032   MCHC 32.5 01/06/2023 1032   RDW 12.6 01/06/2023 1032   RDW 14.2 02/19/2014 0435   LYMPHSABS 1.6 04/15/2020 0401   LYMPHSABS 2.3 02/19/2014 0435   MONOABS 0.4 04/15/2020 0401   MONOABS 0.5 02/19/2014 0435   EOSABS 0.1 04/15/2020 0401   EOSABS 0.1 02/19/2014 0435   BASOSABS 0.0 04/15/2020 0401   BASOSABS 0.0 02/19/2014 0435    Hgb A1C Lab Results  Component Value Date  HGBA1C 5.6 01/06/2023     Assessment and Plan:  Preventative Health Maintenance:  Encouraged her to get a flu shot in the fall Tetanus declined Encouraged her to get her COVID-vaccine Discussed Shingrix vaccine, she will check coverage with her insurance company and schedule visit she would like to have this done Referral to GYN for Pap smear Mammogram ordered-she will call to schedule Referral to GI for screening colonoscopy Encouraged her to consume a balanced diet and exercise regimen As her to see an eye doctor and dentist annually We will check CBC, c-Met, lipid, A1c today  RTC in 1 year for your annual exam Helayne Lo, NP
